# Patient Record
Sex: Male | Born: 1940
Health system: Southern US, Community
[De-identification: ages and names within clinical notes are randomized; demographics above are authoritative.]

---

## 2016-04-30 ENCOUNTER — Inpatient Hospital Stay (HOSPITAL_COMMUNITY): Payer: Non-veteran care

## 2016-04-30 ENCOUNTER — Inpatient Hospital Stay (HOSPITAL_COMMUNITY)
Admission: EM | Admit: 2016-04-30 | Discharge: 2016-05-10 | DRG: 870 | Disposition: A | Discharge status: Skilled Nursing Facility | Payer: Non-veteran care | Source: Other Acute Inpatient Hospital | Attending: Internal Medicine | Admitting: Internal Medicine

## 2016-04-30 DIAGNOSIS — A419 Sepsis, unspecified organism: Secondary | ICD-10-CM | POA: Diagnosis present

## 2016-04-30 DIAGNOSIS — E46 Unspecified protein-calorie malnutrition: Secondary | ICD-10-CM | POA: Diagnosis present

## 2016-04-30 DIAGNOSIS — J9601 Acute respiratory failure with hypoxia: Secondary | ICD-10-CM

## 2016-04-30 DIAGNOSIS — E876 Hypokalemia: Secondary | ICD-10-CM | POA: Diagnosis not present

## 2016-04-30 DIAGNOSIS — R0602 Shortness of breath: Secondary | ICD-10-CM

## 2016-04-30 DIAGNOSIS — E870 Hyperosmolality and hypernatremia: Secondary | ICD-10-CM | POA: Diagnosis present

## 2016-04-30 DIAGNOSIS — G9340 Encephalopathy, unspecified: Secondary | ICD-10-CM

## 2016-04-30 DIAGNOSIS — G9341 Metabolic encephalopathy: Secondary | ICD-10-CM | POA: Diagnosis present

## 2016-04-30 DIAGNOSIS — E872 Acidosis: Secondary | ICD-10-CM

## 2016-04-30 DIAGNOSIS — N179 Acute kidney failure, unspecified: Secondary | ICD-10-CM | POA: Diagnosis present

## 2016-04-30 DIAGNOSIS — R1310 Dysphagia, unspecified: Secondary | ICD-10-CM | POA: Diagnosis present

## 2016-04-30 DIAGNOSIS — E1140 Type 2 diabetes mellitus with diabetic neuropathy, unspecified: Secondary | ICD-10-CM | POA: Diagnosis present

## 2016-04-30 DIAGNOSIS — R0989 Other specified symptoms and signs involving the circulatory and respiratory systems: Secondary | ICD-10-CM

## 2016-04-30 DIAGNOSIS — Z6820 Body mass index (BMI) 20.0-20.9, adult: Secondary | ICD-10-CM | POA: Diagnosis not present

## 2016-04-30 DIAGNOSIS — R6520 Severe sepsis without septic shock: Secondary | ICD-10-CM | POA: Diagnosis present

## 2016-04-30 DIAGNOSIS — E1310 Other specified diabetes mellitus with ketoacidosis without coma: Secondary | ICD-10-CM | POA: Diagnosis present

## 2016-04-30 DIAGNOSIS — N170 Acute kidney failure with tubular necrosis: Secondary | ICD-10-CM | POA: Diagnosis present

## 2016-04-30 DIAGNOSIS — K219 Gastro-esophageal reflux disease without esophagitis: Secondary | ICD-10-CM | POA: Diagnosis present

## 2016-04-30 DIAGNOSIS — J150 Pneumonia due to Klebsiella pneumoniae: Secondary | ICD-10-CM

## 2016-04-30 DIAGNOSIS — R069 Unspecified abnormalities of breathing: Secondary | ICD-10-CM

## 2016-04-30 DIAGNOSIS — D6489 Other specified anemias: Secondary | ICD-10-CM | POA: Diagnosis present

## 2016-04-30 DIAGNOSIS — E875 Hyperkalemia: Secondary | ICD-10-CM | POA: Diagnosis present

## 2016-04-30 DIAGNOSIS — J189 Pneumonia, unspecified organism: Secondary | ICD-10-CM

## 2016-04-30 DIAGNOSIS — D696 Thrombocytopenia, unspecified: Secondary | ICD-10-CM | POA: Diagnosis present

## 2016-04-30 DIAGNOSIS — I248 Other forms of acute ischemic heart disease: Secondary | ICD-10-CM | POA: Diagnosis present

## 2016-04-30 DIAGNOSIS — J9600 Acute respiratory failure, unspecified whether with hypoxia or hypercapnia: Secondary | ICD-10-CM | POA: Diagnosis present

## 2016-04-30 DIAGNOSIS — J9690 Respiratory failure, unspecified, unspecified whether with hypoxia or hypercapnia: Secondary | ICD-10-CM

## 2016-04-30 DIAGNOSIS — I10 Essential (primary) hypertension: Secondary | ICD-10-CM | POA: Diagnosis present

## 2016-04-30 LAB — CBC WITH DIFFERENTIAL/PLATELET
BASOS PCT: 0 %
Basophils Absolute: 0 10*3/uL (ref 0.0–0.1)
EOS ABS: 0 10*3/uL (ref 0.0–0.7)
Eosinophils Relative: 0 %
HCT: 28.4 % — ABNORMAL LOW (ref 39.0–52.0)
HEMOGLOBIN: 9.6 g/dL — AB (ref 13.0–17.0)
LYMPHS ABS: 0.3 10*3/uL — AB (ref 0.7–4.0)
Lymphocytes Relative: 4 %
MCH: 29.5 pg (ref 26.0–34.0)
MCHC: 33.8 g/dL (ref 30.0–36.0)
MCV: 87.4 fL (ref 78.0–100.0)
MONO ABS: 0.2 10*3/uL (ref 0.1–1.0)
MONOS PCT: 3 %
NEUTROS PCT: 93 %
Neutro Abs: 5.7 10*3/uL (ref 1.7–7.7)
Platelets: 205 10*3/uL (ref 150–400)
RBC: 3.25 MIL/uL — ABNORMAL LOW (ref 4.22–5.81)
RDW: 13.8 % (ref 11.5–15.5)
WBC: 6.1 10*3/uL (ref 4.0–10.5)

## 2016-04-30 LAB — RENAL FUNCTION PANEL
Albumin: 2.2 g/dL — ABNORMAL LOW (ref 3.5–5.0)
Anion gap: 11 (ref 5–15)
BUN: 75 mg/dL — AB (ref 6–20)
CALCIUM: 7.2 mg/dL — AB (ref 8.9–10.3)
CO2: 22 mmol/L (ref 22–32)
CREATININE: 4.47 mg/dL — AB (ref 0.61–1.24)
Chloride: 109 mmol/L (ref 101–111)
GFR calc Af Amer: 14 mL/min — ABNORMAL LOW (ref >60)
GFR calc non Af Amer: 12 mL/min — ABNORMAL LOW (ref >60)
GLUCOSE: 116 mg/dL — AB (ref 65–99)
Phosphorus: 3.9 mg/dL (ref 2.5–4.6)
Potassium: 3.5 mmol/L (ref 3.5–5.1)
SODIUM: 142 mmol/L (ref 135–145)

## 2016-04-30 LAB — BASIC METABOLIC PANEL
ANION GAP: 15 (ref 5–15)
ANION GAP: 10 (ref 5–15)
BUN: 157 mg/dL — AB (ref 6–20)
BUN: 52 mg/dL — ABNORMAL HIGH (ref 6–20)
CALCIUM: 7.3 mg/dL — AB (ref 8.9–10.3)
CHLORIDE: 118 mmol/L — AB (ref 101–111)
CO2: 11 mmol/L — ABNORMAL LOW (ref 22–32)
CO2: 25 mmol/L (ref 22–32)
Calcium: 7.4 mg/dL — ABNORMAL LOW (ref 8.9–10.3)
Chloride: 104 mmol/L (ref 101–111)
Creatinine, Ser: 10.39 mg/dL — ABNORMAL HIGH (ref 0.61–1.24)
Creatinine, Ser: 3.32 mg/dL — ABNORMAL HIGH (ref 0.61–1.24)
GFR, EST AFRICAN AMERICAN: 5 mL/min — AB (ref >60)
GFR, EST AFRICAN AMERICAN: 20 mL/min — AB (ref >60)
GFR, EST NON AFRICAN AMERICAN: 4 mL/min — AB (ref >60)
GFR, EST NON AFRICAN AMERICAN: 17 mL/min — AB (ref >60)
Glucose, Bld: 292 mg/dL — ABNORMAL HIGH (ref 65–99)
Glucose, Bld: 210 mg/dL — ABNORMAL HIGH (ref 65–99)
POTASSIUM: 4.3 mmol/L (ref 3.5–5.1)
POTASSIUM: 3.7 mmol/L (ref 3.5–5.1)
SODIUM: 144 mmol/L (ref 135–145)
SODIUM: 139 mmol/L (ref 135–145)

## 2016-04-30 LAB — CBC
HEMATOCRIT: 29.7 % — AB (ref 39.0–52.0)
Hemoglobin: 9.9 g/dL — ABNORMAL LOW (ref 13.0–17.0)
MCH: 29.3 pg (ref 26.0–34.0)
MCHC: 33.3 g/dL (ref 30.0–36.0)
MCV: 87.9 fL (ref 78.0–100.0)
Platelets: 202 10*3/uL (ref 150–400)
RBC: 3.38 MIL/uL — ABNORMAL LOW (ref 4.22–5.81)
RDW: 13.7 % (ref 11.5–15.5)
WBC: 5.9 10*3/uL (ref 4.0–10.5)

## 2016-04-30 LAB — POCT I-STAT, CHEM 8
BUN: >140 mg/dL — ABNORMAL HIGH (ref 6–20)
CALCIUM ION: 1.11 mmol/L — AB (ref 1.15–1.40)
CHLORIDE: 115 mmol/L — AB (ref 101–111)
Creatinine, Ser: 10.9 mg/dL — ABNORMAL HIGH (ref 0.61–1.24)
GLUCOSE: 274 mg/dL — AB (ref 65–99)
HCT: 26 % — ABNORMAL LOW (ref 39.0–52.0)
Hemoglobin: 8.8 g/dL — ABNORMAL LOW (ref 13.0–17.0)
Potassium: 4.2 mmol/L (ref 3.5–5.1)
Sodium: 147 mmol/L — ABNORMAL HIGH (ref 135–145)
TCO2: 13 mmol/L (ref 0–100)

## 2016-04-30 LAB — URINALYSIS, ROUTINE W REFLEX MICROSCOPIC
GLUCOSE, UA: 100 mg/dL — AB
KETONES UR: NEGATIVE mg/dL
LEUKOCYTES UA: NEGATIVE
NITRITE: NEGATIVE
PH: 5 (ref 5.0–8.0)
Protein, ur: NEGATIVE mg/dL
Specific Gravity, Urine: 1.018 (ref 1.005–1.030)

## 2016-04-30 LAB — BLOOD GAS, ARTERIAL
ACID-BASE DEFICIT: 7.7 mmol/L — AB (ref 0.0–2.0)
Bicarbonate: 16.6 mmol/L — ABNORMAL LOW (ref 20.0–28.0)
Drawn by: 44137
FIO2: 1
LHR: 30 {breaths}/min
O2 SAT: 98.8 %
PEEP/CPAP: 5 cmH2O
PH ART: 7.393 (ref 7.350–7.450)
Patient temperature: 95.5
VT: 530 mL
pCO2 arterial: 27.2 mmHg — ABNORMAL LOW (ref 32.0–48.0)
pO2, Arterial: 166 mmHg — ABNORMAL HIGH (ref 83.0–108.0)

## 2016-04-30 LAB — POCT I-STAT 3, ART BLOOD GAS (G3+)
ACID-BASE DEFICIT: 18 mmol/L — AB (ref 0.0–2.0)
Bicarbonate: 8.8 mmol/L — ABNORMAL LOW (ref 20.0–28.0)
O2 SAT: 89 %
PH ART: 7.182 — AB (ref 7.350–7.450)
TCO2: 9 mmol/L (ref 0–100)
pCO2 arterial: 23.4 mmHg — ABNORMAL LOW (ref 32.0–48.0)
pO2, Arterial: 68 mmHg — ABNORMAL LOW (ref 83.0–108.0)

## 2016-04-30 LAB — GLUCOSE, CAPILLARY
GLUCOSE-CAPILLARY: 127 mg/dL — AB (ref 65–99)
GLUCOSE-CAPILLARY: 139 mg/dL — AB (ref 65–99)
GLUCOSE-CAPILLARY: 154 mg/dL — AB (ref 65–99)
GLUCOSE-CAPILLARY: 160 mg/dL — AB (ref 65–99)
GLUCOSE-CAPILLARY: 180 mg/dL — AB (ref 65–99)
GLUCOSE-CAPILLARY: 191 mg/dL — AB (ref 65–99)
GLUCOSE-CAPILLARY: 233 mg/dL — AB (ref 65–99)
GLUCOSE-CAPILLARY: 298 mg/dL — AB (ref 65–99)
GLUCOSE-CAPILLARY: 312 mg/dL — AB (ref 65–99)
GLUCOSE-CAPILLARY: 317 mg/dL — AB (ref 65–99)
GLUCOSE-CAPILLARY: 96 mg/dL (ref 65–99)
Glucose-Capillary: 273 mg/dL — ABNORMAL HIGH (ref 65–99)
Glucose-Capillary: 277 mg/dL — ABNORMAL HIGH (ref 65–99)
Glucose-Capillary: 260 mg/dL — ABNORMAL HIGH (ref 65–99)
Glucose-Capillary: 198 mg/dL — ABNORMAL HIGH (ref 65–99)
Glucose-Capillary: 200 mg/dL — ABNORMAL HIGH (ref 65–99)
Glucose-Capillary: 124 mg/dL — ABNORMAL HIGH (ref 65–99)
Glucose-Capillary: 102 mg/dL — ABNORMAL HIGH (ref 65–99)

## 2016-04-30 LAB — POCT ACTIVATED CLOTTING TIME
ACTIVATED CLOTTING TIME: 125 s
ACTIVATED CLOTTING TIME: 125 s
Activated Clotting Time: 125 seconds
Activated Clotting Time: 158 seconds
Activated Clotting Time: 191 seconds
Activated Clotting Time: 213 seconds
Activated Clotting Time: 219 seconds
Activated Clotting Time: 241 seconds
Activated Clotting Time: 246 seconds

## 2016-04-30 LAB — URIC ACID: Uric Acid, Serum: 11.3 mg/dL — ABNORMAL HIGH (ref 4.4–7.6)

## 2016-04-30 LAB — TROPONIN I
TROPONIN I: 0.04 ng/mL — AB (ref <0.03)
TROPONIN I: 0.04 ng/mL — AB (ref <0.03)
TROPONIN I: 0.4 ng/mL — AB (ref <0.03)
Troponin I: 0.12 ng/mL (ref <0.03)
Troponin I: 0.41 ng/mL (ref <0.03)

## 2016-04-30 LAB — IRON AND TIBC
Iron: 91 ug/dL (ref 45–182)
Saturation Ratios: 51 % — ABNORMAL HIGH (ref 17.9–39.5)
TIBC: 179 ug/dL — ABNORMAL LOW (ref 250–450)
UIBC: 88 ug/dL

## 2016-04-30 LAB — COMPREHENSIVE METABOLIC PANEL
ALBUMIN: 2.2 g/dL — AB (ref 3.5–5.0)
ALT: 12 U/L — ABNORMAL LOW (ref 17–63)
ANION GAP: 14 (ref 5–15)
AST: 21 U/L (ref 15–41)
Alkaline Phosphatase: 43 U/L (ref 38–126)
BUN: 160 mg/dL — ABNORMAL HIGH (ref 6–20)
CO2: 13 mmol/L — AB (ref 22–32)
Calcium: 7.5 mg/dL — ABNORMAL LOW (ref 8.9–10.3)
Chloride: 117 mmol/L — ABNORMAL HIGH (ref 101–111)
Creatinine, Ser: 10.11 mg/dL — ABNORMAL HIGH (ref 0.61–1.24)
GFR calc Af Amer: 5 mL/min — ABNORMAL LOW (ref >60)
GFR calc non Af Amer: 4 mL/min — ABNORMAL LOW (ref >60)
GLUCOSE: 283 mg/dL — AB (ref 65–99)
POTASSIUM: 4 mmol/L (ref 3.5–5.1)
SODIUM: 144 mmol/L (ref 135–145)
Total Bilirubin: 0.7 mg/dL (ref 0.3–1.2)
Total Protein: 4 g/dL — ABNORMAL LOW (ref 6.5–8.1)

## 2016-04-30 LAB — LACTATE DEHYDROGENASE: LDH: 107 U/L (ref 98–192)

## 2016-04-30 LAB — URINE MICROSCOPIC-ADD ON: WBC UA: NONE SEEN WBC/hpf (ref 0–5)

## 2016-04-30 LAB — PHOSPHORUS: Phosphorus: 7.3 mg/dL — ABNORMAL HIGH (ref 2.5–4.6)

## 2016-04-30 LAB — STREP PNEUMONIAE URINARY ANTIGEN: Strep Pneumo Urinary Antigen: NEGATIVE

## 2016-04-30 LAB — CREATININE, URINE, RANDOM: CREATININE, URINE: 70.7 mg/dL

## 2016-04-30 LAB — CK: CK TOTAL: 199 U/L (ref 49–397)

## 2016-04-30 LAB — SODIUM, URINE, RANDOM: Sodium, Ur: 73 mmol/L

## 2016-04-30 LAB — MRSA PCR SCREENING: MRSA by PCR: NEGATIVE

## 2016-04-30 LAB — BRAIN NATRIURETIC PEPTIDE: B NATRIURETIC PEPTIDE 5: 114.7 pg/mL — AB (ref 0.0–100.0)

## 2016-04-30 LAB — LACTIC ACID, PLASMA
LACTIC ACID, VENOUS: 3.5 mmol/L — AB (ref 0.5–1.9)
Lactic Acid, Venous: 3 mmol/L (ref 0.5–1.9)

## 2016-04-30 LAB — PROCALCITONIN: PROCALCITONIN: 28.75 ng/mL

## 2016-04-30 MED ORDER — PRISMASOL BGK 0/2.5 32-2.5 MEQ/L IV SOLN
INTRAVENOUS | Status: DC
  Administered 2016-04-30: 08:00:00 via INTRAVENOUS_CENTRAL
  Filled 2016-04-30 (×9): qty 5000

## 2016-04-30 MED ORDER — MIDAZOLAM HCL 2 MG/2ML IJ SOLN
1.0000 mg | INTRAMUSCULAR | Status: DC | PRN
  Administered 2016-05-01 – 2016-05-03 (×6): 1 mg via INTRAVENOUS
  Filled 2016-04-30 (×6): qty 2

## 2016-04-30 MED ORDER — VANCOMYCIN HCL 500 MG IV SOLR
500.0000 mg | INTRAVENOUS | Status: DC
  Administered 2016-05-01: 500 mg via INTRAVENOUS
  Filled 2016-04-30: qty 500

## 2016-04-30 MED ORDER — SODIUM CHLORIDE 0.9 % IV SOLN: INTRAVENOUS | Status: DC

## 2016-04-30 MED ORDER — SODIUM CHLORIDE 0.9 % IV BOLUS (SEPSIS)
500.0000 mL | Freq: Once | INTRAVENOUS | Status: AC
  Administered 2016-04-30: 500 mL via INTRAVENOUS

## 2016-04-30 MED ORDER — PIPERACILLIN-TAZOBACTAM 3.375 G IVPB 30 MIN
3.3750 g | Freq: Four times a day (QID) | INTRAVENOUS | Status: DC
  Administered 2016-04-30 – 2016-05-02 (×9): 3.375 g via INTRAVENOUS
  Filled 2016-04-30 (×12): qty 50

## 2016-04-30 MED ORDER — DEXTROSE-NACL 5-0.45 % IV SOLN
INTRAVENOUS | Status: DC
  Administered 2016-04-30 – 2016-05-01 (×2): via INTRAVENOUS

## 2016-04-30 MED ORDER — PRISMASOL BGK 0/2.5 32-2.5 MEQ/L IV SOLN
INTRAVENOUS | Status: DC
  Administered 2016-04-30: 07:00:00 via INTRAVENOUS_CENTRAL
  Filled 2016-04-30 (×3): qty 5000

## 2016-04-30 MED ORDER — SODIUM CHLORIDE 0.9 % IV SOLN
Freq: Once | INTRAVENOUS | Status: AC
  Administered 2016-04-30: 15:00:00 via INTRAVENOUS

## 2016-04-30 MED ORDER — PRISMASOL BGK 4/2.5 32-4-2.5 MEQ/L IV SOLN
INTRAVENOUS | Status: DC
  Administered 2016-04-30 – 2016-05-04 (×35): via INTRAVENOUS_CENTRAL
  Filled 2016-04-30 (×45): qty 5000

## 2016-04-30 MED ORDER — HEPARIN SODIUM (PORCINE) 1000 UNIT/ML DIALYSIS
1000.0000 [IU] | INTRAMUSCULAR | Status: DC | PRN
  Filled 2016-04-30: qty 3

## 2016-04-30 MED ORDER — MIDAZOLAM HCL 2 MG/2ML IJ SOLN
INTRAMUSCULAR | Status: AC
  Administered 2016-04-30: 08:00:00
  Filled 2016-04-30: qty 4

## 2016-04-30 MED ORDER — SODIUM CHLORIDE 0.9 % IJ SOLN
250.0000 [IU]/h | INTRAMUSCULAR | Status: DC
  Administered 2016-04-30: 1200 [IU]/h via INTRAVENOUS_CENTRAL
  Administered 2016-04-30: 500 [IU]/h via INTRAVENOUS_CENTRAL
  Administered 2016-04-30: 1500 [IU]/h via INTRAVENOUS_CENTRAL
  Filled 2016-04-30 (×3): qty 2

## 2016-04-30 MED ORDER — SODIUM CHLORIDE 0.9 % IV SOLN
INTRAVENOUS | Status: DC
  Administered 2016-04-30: 2.4 [IU]/h via INTRAVENOUS
  Filled 2016-04-30: qty 2.5

## 2016-04-30 MED ORDER — PRISMASOL BGK 4/2.5 32-4-2.5 MEQ/L IV SOLN
INTRAVENOUS | Status: DC
  Administered 2016-04-30 – 2016-05-04 (×12): via INTRAVENOUS_CENTRAL
  Filled 2016-04-30 (×16): qty 5000

## 2016-04-30 MED ORDER — SODIUM BICARBONATE 8.4 % IV SOLN
INTRAVENOUS | Status: DC
  Administered 2016-04-30 (×3): via INTRAVENOUS
  Filled 2016-04-30 (×6): qty 150

## 2016-04-30 MED ORDER — SODIUM BICARBONATE 8.4 % IV SOLN
INTRAVENOUS | Status: AC
  Filled 2016-04-30: qty 100

## 2016-04-30 MED ORDER — PRISMASOL BGK 0/2.5 32-2.5 MEQ/L IV SOLN
INTRAVENOUS | Status: DC
  Administered 2016-04-30: 08:00:00 via INTRAVENOUS_CENTRAL
  Filled 2016-04-30 (×3): qty 5000

## 2016-04-30 MED ORDER — DEXTROSE 5 % IV SOLN
500.0000 mg | Freq: Every day | INTRAVENOUS | Status: DC
  Administered 2016-04-30 – 2016-05-02 (×3): 500 mg via INTRAVENOUS
  Filled 2016-04-30 (×3): qty 500

## 2016-04-30 MED ORDER — SODIUM CHLORIDE 0.9 % IV SOLN
250.0000 mL | INTRAVENOUS | Status: DC | PRN
  Administered 2016-04-30 – 2016-05-04 (×4): 250 mL via INTRAVENOUS

## 2016-04-30 MED ORDER — FAMOTIDINE IN NACL 20-0.9 MG/50ML-% IV SOLN
20.0000 mg | Freq: Two times a day (BID) | INTRAVENOUS | Status: DC
  Administered 2016-04-30 – 2016-05-01 (×2): 20 mg via INTRAVENOUS
  Filled 2016-04-30 (×3): qty 50

## 2016-04-30 MED ORDER — PRISMASOL BGK 4/2.5 32-4-2.5 MEQ/L IV SOLN
INTRAVENOUS | Status: DC
  Administered 2016-04-30 – 2016-05-04 (×14): via INTRAVENOUS_CENTRAL
  Filled 2016-04-30 (×18): qty 5000

## 2016-04-30 MED ORDER — DEXTROSE 5 % IV SOLN
2.0000 ug/min | INTRAVENOUS | Status: DC
  Administered 2016-04-30: 15 ug/min via INTRAVENOUS
  Administered 2016-04-30 (×2): 10 ug/min via INTRAVENOUS
  Administered 2016-05-01: 15 ug/min via INTRAVENOUS
  Filled 2016-04-30 (×3): qty 4

## 2016-04-30 MED ORDER — HEPARIN SODIUM (PORCINE) 5000 UNIT/ML IJ SOLN
5000.0000 [IU] | Freq: Three times a day (TID) | INTRAMUSCULAR | Status: DC
Start: 2016-04-30 — End: 2016-05-04
  Administered 2016-04-30 – 2016-05-04 (×11): 5000 [IU] via SUBCUTANEOUS
  Filled 2016-04-30 (×13): qty 1

## 2016-04-30 MED ORDER — NOREPINEPHRINE BITARTRATE 1 MG/ML IV SOLN
0.0000 ug/min | INTRAVENOUS | Status: DC
  Filled 2016-04-30: qty 4

## 2016-04-30 MED ORDER — INSULIN GLARGINE 100 UNIT/ML ~~LOC~~ SOLN
25.0000 [IU] | SUBCUTANEOUS | Status: DC
  Administered 2016-04-30 – 2016-05-01 (×2): 25 [IU] via SUBCUTANEOUS
  Filled 2016-04-30 (×3): qty 0.25

## 2016-04-30 MED ORDER — SODIUM CHLORIDE 0.9 % IV SOLN: INTRAVENOUS | Status: AC

## 2016-04-30 MED ORDER — ORAL CARE MOUTH RINSE
15.0000 mL | Freq: Four times a day (QID) | OROMUCOSAL | Status: DC
  Administered 2016-05-01 – 2016-05-06 (×24): 15 mL via OROMUCOSAL

## 2016-04-30 MED ORDER — FENTANYL CITRATE (PF) 100 MCG/2ML IJ SOLN
INTRAMUSCULAR | Status: AC
  Filled 2016-04-30: qty 4

## 2016-04-30 MED ORDER — HEPARIN BOLUS VIA INFUSION (CRRT)
1000.0000 [IU] | INTRAVENOUS | Status: DC | PRN
  Administered 2016-04-30: 1000 [IU] via INTRAVENOUS_CENTRAL
  Filled 2016-04-30: qty 1000

## 2016-04-30 MED ORDER — ALBUTEROL SULFATE (2.5 MG/3ML) 0.083% IN NEBU: 2.5000 mg | INHALATION_SOLUTION | RESPIRATORY_TRACT | Status: DC | PRN

## 2016-04-30 MED ORDER — INSULIN ASPART 100 UNIT/ML ~~LOC~~ SOLN
1.0000 [IU] | SUBCUTANEOUS | Status: DC
  Administered 2016-05-01 – 2016-05-02 (×7): 1 [IU] via SUBCUTANEOUS

## 2016-04-30 MED ORDER — FENTANYL BOLUS VIA INFUSION
25.0000 ug | INTRAVENOUS | Status: DC | PRN
  Administered 2016-05-02: 50 ug via INTRAVENOUS
  Administered 2016-05-02: 25 ug via INTRAVENOUS
  Administered 2016-05-04: 50 ug via INTRAVENOUS
  Administered 2016-05-05: 25 ug via INTRAVENOUS
  Filled 2016-04-30: qty 25

## 2016-04-30 MED ORDER — DEXTROSE 10 % IV SOLN
INTRAVENOUS | Status: DC | PRN
  Administered 2016-05-01: 17:00:00 via INTRAVENOUS

## 2016-04-30 MED ORDER — SODIUM CHLORIDE 0.9 % IV SOLN
INTRAVENOUS | Status: DC
  Administered 2016-04-30: 11:00:00 via INTRAVENOUS

## 2016-04-30 MED ORDER — CHLORHEXIDINE GLUCONATE 0.12 % MT SOLN
15.0000 mL | Freq: Two times a day (BID) | OROMUCOSAL | Status: DC
  Administered 2016-04-30 – 2016-05-06 (×12): 15 mL via OROMUCOSAL
  Filled 2016-04-30 (×3): qty 15

## 2016-04-30 MED ORDER — FENTANYL CITRATE (PF) 100 MCG/2ML IJ SOLN: 50.0000 ug | Freq: Once | INTRAMUSCULAR | Status: AC

## 2016-04-30 MED ORDER — ONDANSETRON HCL 4 MG/2ML IJ SOLN
4.0000 mg | Freq: Four times a day (QID) | INTRAMUSCULAR | Status: DC | PRN
Start: 2016-04-30 — End: 2016-05-10

## 2016-04-30 MED ORDER — SODIUM CHLORIDE 0.9 % FOR CRRT
INTRAVENOUS_CENTRAL | Status: DC | PRN
  Administered 2016-05-03: 11:00:00 via INTRAVENOUS_CENTRAL
  Filled 2016-04-30 (×2): qty 1000

## 2016-04-30 MED ORDER — SODIUM CHLORIDE 0.9 % IV SOLN
25.0000 ug/h | INTRAVENOUS | Status: DC
  Administered 2016-04-30: 50 ug/h via INTRAVENOUS
  Administered 2016-05-02: 250 ug/h via INTRAVENOUS
  Administered 2016-05-02: 100 ug/h via INTRAVENOUS
  Administered 2016-05-03: 400 ug/h via INTRAVENOUS
  Administered 2016-05-04: 200 ug/h via INTRAVENOUS
  Administered 2016-05-04: 300 ug/h via INTRAVENOUS
  Administered 2016-05-04: 200 ug/h via INTRAVENOUS
  Administered 2016-05-05: 100 ug/h via INTRAVENOUS
  Filled 2016-04-30 (×11): qty 50

## 2016-04-30 MED ORDER — ACETAMINOPHEN 325 MG PO TABS: 650.0000 mg | ORAL_TABLET | ORAL | Status: DC | PRN

## 2016-04-30 MED ORDER — HEPARIN SODIUM (PORCINE) 5000 UNIT/ML IJ SOLN: 5000.0000 [IU] | Freq: Three times a day (TID) | INTRAMUSCULAR | Status: DC

## 2016-04-30 MED ORDER — INSULIN ASPART 100 UNIT/ML ~~LOC~~ SOLN
0.0000 [IU] | SUBCUTANEOUS | Status: DC
  Administered 2016-04-30: 11 [IU] via SUBCUTANEOUS

## 2016-04-30 NOTE — Procedures (Signed)
Intubation Procedure Note Ross CanterburyDonnie Daniel [id number] [birth year]  Procedure: Intubation Indications: Respiratory insufficiency  Procedure Details Consent: Unable to obtain consent because of emergent medical necessity. Time Out: Verified patient identification, verified procedure, site/side was marked, verified correct patient position, special equipment/implants available, medications/allergies/relevent history reviewed, required imaging and test results available.  Performed  MAC and 3 Medications:  Fentanyl 100 mcg Etomidate 20 mg Versed 2mg  NMB rocuronium 50 mg   Evaluation Hemodynamic Status: Transient hypotension treated with pressors; O2 sats: stable throughout Patient's Current Condition: stable Complications: No apparent complications Patient did tolerate procedure well. Chest X-ray ordered to verify placement.  CXR: pending.   Brett CanalesSteve Minor ACNP [facility] Pager [phone] till 3 pm If no answer page [phone] 04/30/2016, 8:26 AM   Alyson ReedyWesam G. Judye Lorino, M.D. [facility]. Pager: [phone]. After hours pager: [phone].

## 2016-04-30 NOTE — Progress Notes (Signed)
Called bedside with patient in respiratory failure.  AMS and clearly becoming more fatigued.  ABG revealed severe metabolic acidosis.  On exam patient arouses but not enough to protect airway.  Labs reviewed, Na is down to 144 and K down to 4 but last bicarb is 13.  Patient needs to be intubated.  Will push 2 amps of bicarb prior to sedating and paralyzing given profound metabolic acidosis.  After intubation will get a CXR and ABG to confirm then start CRRT.  Will also start levophed prior to intubation as I anticipate patient will drop his pressor with sedation.  Will continue to follow.  Discussed with bedside RN and PCCM-NP.  The patient is critically ill with multiple organ systems failure and requires high complexity decision making for assessment and support, frequent evaluation and titration of therapies, application of advanced monitoring technologies and extensive interpretation of multiple databases.   Critical Care Time devoted to patient care services described in this note is  45  Minutes. This time reflects time of care of this signee Dr Koren BoundWesam Yacoub. This critical care time does not reflect procedure time, or teaching time or supervisory time of PA/NP/Med student/Med Resident etc but could involve care discussion time.  Alyson ReedyWesam G. Yacoub, M.D. [facility]. Pager: [phone]. After hours pager: [phone].

## 2016-04-30 NOTE — Progress Notes (Signed)
eLink Physician-Brief Progress Note Patient Name: Ross CanterburyDonnie Wolken DOB: June 02, 1941 MRN: [id number]   Date of Service  04/30/2016  HPI/Events of Note  Multiple issues: 1. DKA with blood glucose now = 154 and 2. CVP = 2.   eICU Interventions  Will order: 1. BMP now to be sure AG has remained closed.  2. 0.9 NaCl 500 mL IV now.      Intervention Category Intermediate Interventions: Hyperglycemia - evaluation and treatment  Tammee Thielke Dennard Nipugene 04/30/2016, 9:14 PM

## 2016-04-30 NOTE — Progress Notes (Signed)
eLink Physician-Brief Progress Note Patient Name: Ross CanterburyDonnie Daniel DOB: [birth year] MRN: [id number]   Date of Service  04/30/2016  HPI/Events of Note  Anion Gap = 10 and HCO3- = 25. Blood glucose = 180.   eICU Interventions  Will order: 1. D/C NaHCO3 IV infusion.  2. Transition to Lantus/Q 4 hour Novolog SSI.     Intervention Category Intermediate Interventions: Hyperglycemia - evaluation and treatment  Sommer,Steven Dennard Nipugene 04/30/2016, 10:34 PM

## 2016-04-30 NOTE — Procedures (Signed)
Hemodialysis Catheter Insertion Procedure Note Janece CanterburyDonnie Charon [id number] Jan 03, 1941  Procedure: Insertion of Hemodialysis Catheter Indications: CVVH  Procedure Details Consent: Unable to obtain consent because of emergent medical necessity. Time Out: Verified patient identification, verified procedure, site/side was marked, verified correct patient position, special equipment/implants available, medications/allergies/relevent history reviewed, required imaging and test results available.  Performed  Maximum sterile technique was used including antiseptics, cap, gloves, gown, hand hygiene, mask and sheet. Skin prep: Chlorhexidine; local anesthetic administered A antimicrobial bonded/coated triple lumen catheter was placed in the left internal jugular vein using the Seldinger technique.  Evaluation Blood flow good Complications: No apparent complications Patient did tolerate procedure well. Chest X-ray ordered to verify placement.  CXR: pending.  Procedure performed under direct ultrasound guidance for real time vessel cannulation.      Rutherford Guysahul Gabrille Kilbride, GeorgiaPA - C [place] [facility] Pgr: [phone]  or [phone] 04/30/2016, 4:56 AM

## 2016-04-30 NOTE — Procedures (Signed)
Critical ABG results hand delivered to Dr. Isaiah SergeMannam.

## 2016-04-30 NOTE — Progress Notes (Signed)
eLink Physician-Brief Progress Note Patient Name: Ross CanterburyDonnie Pineau DOB: [birth year] MRN: [id number]   Date of Service  04/30/2016  HPI/Events of Note  Notified of need for stress ulcer prophylaxis. Intubated and ventilated.   eICU Interventions  Will order: 1. Pepcid 20 mg IV now and BID.     Intervention Category Intermediate Interventions: Best-practice therapies (e.g. DVT, beta blocker, etc.)  Kristyanna Barcelo Eugene 04/30/2016, 3:56 PM

## 2016-04-30 NOTE — Consult Note (Signed)
Reason for Consult:AKI, huyperkalemia, acidemia Referring Physician: Dr. Coralyn HellingSimonds  Ross Daniel is an 75 y.o. male.  HPI: [id number]   [facility] after being found down , confused, Cr 14.3/BUN 190, WBC 28.5, and hypotension.  Has pneu on CXR.  He cannot give reliable hx.  Says DM for 8 yr but denies HTN, FH of renal dz, no hx stones, UTIs, no rash or pain.  OX1.  Has Lisinopril 20mg , Metformen 1mg  bid, Meloxicam, gabapentin, glipizide, sidenifil in med bag. Bicarb of 3 an K of 7.8 there.  Now is 4.8 via I Stat. Knows on Ibuprofen also.\  Review of systems not obtained due to patient factors.  No past medical history on file.  No past surgical history on file.  No family history on file.  Social History:  has no tobacco, alcohol, and drug history on file.  Allergies: No Known Allergies  Medications:  I have reviewed the patient's current medications. Prior to Admission:  No prescriptions prior to admission.    Results for orders placed or performed during the hospital encounter of 04/30/16 (from the past 48 hour(s))  Glucose, capillary     Status: Abnormal   Collection Time: 04/30/16  3:02 AM  Result Value Ref Range   Glucose-Capillary 317 (H) 65 - 99 mg/dL  Lactic acid, plasma     Status: Abnormal   Collection Time: 04/30/16  4:32 AM  Result Value Ref Range   Lactic Acid, Venous 3.5 (HH) 0.5 - 1.9 mmol/L    Comment: CRITICAL RESULT CALLED TO, READ BACK BY AND VERIFIED WITH: J.DUNLAP,RN 0543 04/30/16 M.CAMPBELL   I-STAT 3, arterial blood gas (G3+)     Status: Abnormal   Collection Time: 04/30/16  5:24 AM  Result Value Ref Range   pH, Arterial 7.182 (LL) 7.350 - 7.450   pCO2 arterial 23.4 (L) 32.0 - 48.0 mmHg   pO2, Arterial 68.0 (L) 83.0 - 108.0 mmHg   Bicarbonate 8.8 (L) 20.0 - 28.0 mmol/L   TCO2 9 0 - 100 mmol/L   O2 Saturation 89.0 %   Acid-base deficit 18.0 (H) 0.0 - 2.0 mmol/L   Patient temperature HIDE    Collection site RADIAL, ALLEN'S TEST  ACCEPTABLE    Drawn by RT    Sample type ARTERIAL    Comment NOTIFIED PHYSICIAN     Dg Chest Port 1 View  Result Date: 04/30/2016 CLINICAL DATA:  Hemodialysis catheter placement.  Initial encounter. EXAM: PORTABLE CHEST 1 VIEW COMPARISON:  None. FINDINGS: The patient's hemodialysis catheter noted ending overlying the mid SVC. Bibasilar airspace opacities, left greater than right, likely reflect multifocal pneumonia. Asymmetric pulmonary edema might have a similar appearance. No pleural effusion or pneumothorax is seen. The cardiomediastinal silhouette is normal in size. No acute osseous abnormalities are identified. IMPRESSION: 1. Hemodialysis catheter noted ending overlying the mid SVC. 2. Bibasilar airspace opacities, left greater than right, likely reflect multifocal pneumonia. Asymmetric pulmonary edema might have a similar appearance. Electronically Signed   By: Roanna RaiderJeffery  Chang M.D.   On: 04/30/2016 05:05    ROS Blood pressure (!) 114/58, pulse (!) 101, temperature (!) 94.4 F (34.7 C), temperature source Oral, resp. rate 14, height 5\' 7"  (1.702 m), weight 61.3 kg (135 lb 2.3 oz), SpO2 93 %. Physical Exam Physical Examination: General appearance - confused, unkempt Mental status - OX1 Eyes - pupils equal and reactive, extraocular eye movements intact, funduscopic exam normal, discs flat and sharp Mouth - few teeth, poor hygiene Neck -  adenopathy noted PCL Lymphatics - posterior cervical nodes Chest - scattered rales and rhonchi Heart - normal rate, regular rhythm, normal S1, S2, no murmurs, rubs, clicks or gallops Abdomen - pos bs, liver down 4 cm.  soft Neurological - HOH, sym facies, moves all extrem. Ox1 Extremities - peripheral pulses normal, no pedal edema, no clubbing or cyanosis, no pedal edema noted Skin - normal coloration and turgor, no rashes, no suspicious skin lesions noted  Assessment/Plan: 1 AKI cannot r/o CKD, Multiple toxins, Lisinopril, Metformen, NSAIDs, in setting  of low bps.  PMH not clear, can call primary in am.  Vol ok to low,  Will use CRRT to lower K, solute,  2 DM 3 Hypertension: not clear but on Lisinopril 4. Anemia not an issue 5. 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 Toxic not clearly drug 7 Pneu on AB, sepsis tx 8 Sepsis P CRRt, U/S, UA, reeval chem, get Hx, AB, ivf.  Ross Daniel L 04/30/2016, 5:58 AM

## 2016-04-30 NOTE — Progress Notes (Signed)
Pharmacy Antibiotic Note  Janece CanterburyDonnie Retz is a 75 y.o. male admitted on 04/30/2016 with ARF/PNA/sepsis .  Pharmacy has been consulted for Vancomycin and Zosyn dosing.  Vancomycin 1250 mg IV and Zosyn 3.375 g IV given in ED at ~0030.  CVVH to start shortly.  Plan: Vancomycin 500 mg IV q24h  Zosyn 3.375 g IV q6h  Height: 5\' 7"  (170.2 cm) Weight: 135 lb 2.3 oz (61.3 kg) IBW/kg (Calculated) : 66.1  Temp (24hrs), Avg:94.4 F (34.7 C), Min:94.4 F (34.7 C), Max:94.4 F (34.7 C)  [facility]  WBC  28.6 Hgb  12.9   Hct  39.7 Plt  392  SCr  14.3  No results for input(s): WBC, CREATININE, LATICACIDVEN, VANCOTROUGH, VANCOPEAK, VANCORANDOM, GENTTROUGH, GENTPEAK, GENTRANDOM, TOBRATROUGH, TOBRAPEAK, TOBRARND, AMIKACINPEAK, AMIKACINTROU, AMIKACIN in the last 168 hours.  CrCl cannot be calculated (No order found.).    No Known Allergies   Eddie Candlebbott, Alanta Scobey Vernon 04/30/2016 5:35 AM

## 2016-04-30 NOTE — H&P (Addendum)
PULMONARY / CRITICAL CARE MEDICINE   Name: Ross Daniel MRN: [id number] DOB: 04-25-1941    ADMISSION DATE:  04/30/2016 CONSULTATION DATE:  04/30/16  REFERRING MD:  [facility]  CHIEF COMPLAINT:  Pneumonia, sepsis, AKI  HISTORY OF PRESENT ILLNESS:   [name] is a 75 year old with possible history of diabetes, hypertension. He was found unresponsive in his underwear outside his house by neightbours. He was brought to [facility] ED where he was found to have pneumonia, sepsis with severe anion Metabolic acidosis, lactic acidosis, AKI (labs below). He has been transferred to [facility] ED for further evaluation.   PAST MEDICAL HISTORY :  He  has no past medical history on file.  PAST SURGICAL HISTORY: He  has no past surgical history on file.  No Known Allergies  No current facility-administered medications on file prior to encounter.    No current outpatient prescriptions on file prior to encounter.    FAMILY HISTORY:  His has no family status information on file.    SOCIAL HISTORY: He    REVIEW OF SYSTEMS:   Unable to obtain as pt is unresponsive.  SUBJECTIVE:    VITAL SIGNS: BP (!) 94/49 (BP Location: Right Arm)   Pulse (!) 105   Temp (!) 94.4 F (34.7 C) (Oral)   Resp (!) 22   Ht 5\' 7"  (1.702 m)   Wt 135 lb 2.3 oz (61.3 kg)   SpO2 97%   BMI 21.17 kg/m   HEMODYNAMICS:    VENTILATOR SETTINGS:    INTAKE / OUTPUT: No intake/output data recorded.  PHYSICAL EXAMINATION: General:  Awake, confused. Neuro:  Moves all four extremities, no focal deficits HEENT:  PERRL EOMI Cardiovascular:  Tachycardia, No MRG Lungs:  Clear, No wheeze, crackles Abdomen:  Soft, + BS Musculoskeletal:  Normal tone and bulk Skin:  Intact  LABS:  BMET No results for input(s): NA, K, CL, CO2, BUN, CREATININE, GLUCOSE in the last 168 hours.  Electrolytes No results for input(s): CALCIUM, MG, PHOS in the last 168 hours.  CBC No results for input(s): WBC, HGB, HCT, PLT in the last  168 hours.  Coag's No results for input(s): APTT, INR in the last 168 hours.  Sepsis Markers No results for input(s): LATICACIDVEN, PROCALCITON, O2SATVEN in the last 168 hours.  ABG No results for input(s): PHART, PCO2ART, PO2ART in the last 168 hours.  Liver Enzymes No results for input(s): AST, ALT, ALKPHOS, BILITOT, ALBUMIN in the last 168 hours.  Cardiac Enzymes No results for input(s): TROPONINI, PROBNP in the last 168 hours.  Glucose  Recent Labs Lab 04/30/16 0302  GLUCAP 317*    Imaging No results found.  STUDIES:  Labs studies from [facility] Ph 7.046/13/94/94% Bicarb 3 Anion gap 31 Lactic acid 4.1 WBC 28 BUN/Cr 190/13.4 K 7.8 Tylenol, alcohol, salicylate- Neg, UDS- Neg  CT head 04/29/16- No acute findings CT chest abdomen pelvis 04/29/16-Airspace consolidation in the left lower lobe consistent with acute pneumonia. Similar findings lesser degree on the right. Severe bilateral pan-lobar emphysema. No acute findings in the abdomen, pelvis.  CULTURES: Bcx X 2 9/6>  Ucx 9/6 > Sputum Cx 9/6 >  ANTIBIOTICS: Vanco 9/6> Zosyn 9/6> Azithro 9/6>  SIGNIFICANT EVENTS: 9/6- Admit with sepsis, DKA, AKI  LINES/TUBES:  DISCUSSION: 75 year old with diabetes, hypertension admitted with severe sepsis, multilobar pneumonia, possible aspiration in the setting of acute kidney injury, lactic acidosis, hyperkalemia.  ASSESSMENT / PLAN:  PULMONARY A: Pneumonia, aspiration P:   Continue supplemental O2 CXR, repeat ABG. Abx as  above  CARDIOVASCULAR A:  Sepsis, hypotension Got total 4 lt NS so far. P:  Continue fluid resuscitation Bolus 1 more Lt and then 125 cc/hr Follow LA, troponin Echo in AM  RENAL A:   AKI Hyperkalemia P:   Given insulin, dextrose, calcium gluc, bicarb Follow BMP, recheck EKG HD cath in anticipation of dialysis. Bicarb drip Nephrology already called.  GASTROINTESTINAL A:   Stable P:   Keep NPO  HEMATOLOGIC A:    Leukocytosis, sepsis P:  Monitor  INFECTIOUS A:   Multilobar pneumonia P:   Continue vanco, zosyn Add azithro Check urine pneumococcal, legionella Follow Pct and cultures.  ENDOCRINE A:   DM DKA P:   Start insulin drip DKA protocol  NEUROLOGIC A:   Altered Encephalopathy likely from sepsis, metabolic derangements P:    FAMILY  - Updates: No family at bedside. - Inter-disciplinary family meet or Palliative Care meeting due by:  9/6.  Critical care time- 45 mins   Chilton GreathousePraveen Shaunika Italiano MD [place] [facility] Pager [phone] If no answer or after 3pm call: [phone] 04/30/2016, 4:41 AM

## 2016-05-01 ENCOUNTER — Other Ambulatory Visit (HOSPITAL_COMMUNITY): Payer: Self-pay

## 2016-05-01 ENCOUNTER — Inpatient Hospital Stay (HOSPITAL_COMMUNITY): Payer: Non-veteran care

## 2016-05-01 DIAGNOSIS — R0989 Other specified symptoms and signs involving the circulatory and respiratory systems: Secondary | ICD-10-CM

## 2016-05-01 DIAGNOSIS — R069 Unspecified abnormalities of breathing: Secondary | ICD-10-CM

## 2016-05-01 DIAGNOSIS — J989 Respiratory disorder, unspecified: Secondary | ICD-10-CM

## 2016-05-01 LAB — GLUCOSE, CAPILLARY
GLUCOSE-CAPILLARY: 148 mg/dL — AB (ref 65–99)
GLUCOSE-CAPILLARY: 90 mg/dL (ref 65–99)
GLUCOSE-CAPILLARY: 113 mg/dL — AB (ref 65–99)
GLUCOSE-CAPILLARY: 132 mg/dL — AB (ref 65–99)
GLUCOSE-CAPILLARY: 119 mg/dL — AB (ref 65–99)
GLUCOSE-CAPILLARY: 130 mg/dL — AB (ref 65–99)
Glucose-Capillary: 118 mg/dL — ABNORMAL HIGH (ref 65–99)
Glucose-Capillary: 129 mg/dL — ABNORMAL HIGH (ref 65–99)
Glucose-Capillary: 130 mg/dL — ABNORMAL HIGH (ref 65–99)

## 2016-05-01 LAB — BASIC METABOLIC PANEL
ANION GAP: 9 (ref 5–15)
BUN: 37 mg/dL — ABNORMAL HIGH (ref 6–20)
CALCIUM: 7.4 mg/dL — AB (ref 8.9–10.3)
CO2: 26 mmol/L (ref 22–32)
CREATININE: 2.66 mg/dL — AB (ref 0.61–1.24)
Chloride: 105 mmol/L (ref 101–111)
GFR calc Af Amer: 26 mL/min — ABNORMAL LOW (ref >60)
GFR calc non Af Amer: 22 mL/min — ABNORMAL LOW (ref >60)
GLUCOSE: 121 mg/dL — AB (ref 65–99)
Potassium: 3.9 mmol/L (ref 3.5–5.1)
Sodium: 140 mmol/L (ref 135–145)

## 2016-05-01 LAB — RENAL FUNCTION PANEL
ALBUMIN: 2.2 g/dL — AB (ref 3.5–5.0)
ANION GAP: 11 (ref 5–15)
Albumin: 2.4 g/dL — ABNORMAL LOW (ref 3.5–5.0)
Anion gap: 5 (ref 5–15)
BUN: 31 mg/dL — ABNORMAL HIGH (ref 6–20)
BUN: 19 mg/dL (ref 6–20)
CHLORIDE: 103 mmol/L (ref 101–111)
CO2: 24 mmol/L (ref 22–32)
CO2: 28 mmol/L (ref 22–32)
CREATININE: 1.64 mg/dL — AB (ref 0.61–1.24)
Calcium: 7.4 mg/dL — ABNORMAL LOW (ref 8.9–10.3)
Calcium: 7.6 mg/dL — ABNORMAL LOW (ref 8.9–10.3)
Chloride: 105 mmol/L (ref 101–111)
Creatinine, Ser: 2.3 mg/dL — ABNORMAL HIGH (ref 0.61–1.24)
GFR calc Af Amer: 31 mL/min — ABNORMAL LOW (ref >60)
GFR calc Af Amer: 47 mL/min — ABNORMAL LOW (ref >60)
GFR calc non Af Amer: 27 mL/min — ABNORMAL LOW (ref >60)
GFR, EST NON AFRICAN AMERICAN: 40 mL/min — AB (ref >60)
GLUCOSE: 159 mg/dL — AB (ref 65–99)
Glucose, Bld: 118 mg/dL — ABNORMAL HIGH (ref 65–99)
PHOSPHORUS: 2.9 mg/dL (ref 2.5–4.6)
POTASSIUM: 4.2 mmol/L (ref 3.5–5.1)
Phosphorus: 2.8 mg/dL (ref 2.5–4.6)
Potassium: 4.5 mmol/L (ref 3.5–5.1)
SODIUM: 138 mmol/L (ref 135–145)
SODIUM: 138 mmol/L (ref 135–145)

## 2016-05-01 LAB — BLOOD GAS, ARTERIAL
ACID-BASE DEFICIT: 1.5 mmol/L (ref 0.0–2.0)
Bicarbonate: 22.2 mmol/L (ref 20.0–28.0)
DRAWN BY: 36527
FIO2: 70
LHR: 30 {breaths}/min
MECHVT: 530 mL
O2 SAT: 95.5 %
PATIENT TEMPERATURE: 98.1
PEEP/CPAP: 5 cmH2O
PH ART: 7.428 (ref 7.350–7.450)
PO2 ART: 80.1 mmHg — AB (ref 83.0–108.0)
pCO2 arterial: 34.1 mmHg (ref 32.0–48.0)

## 2016-05-01 LAB — LACTIC ACID, PLASMA
Lactic Acid, Venous: 2.3 mmol/L (ref 0.5–1.9)
Lactic Acid, Venous: 2.1 mmol/L (ref 0.5–1.9)

## 2016-05-01 LAB — PROCALCITONIN: Procalcitonin: 33.04 ng/mL

## 2016-05-01 LAB — URINE CULTURE: CULTURE: NO GROWTH

## 2016-05-01 LAB — PARATHYROID HORMONE, INTACT (NO CA): PTH: 218 pg/mL — AB (ref 15–65)

## 2016-05-01 LAB — PROTEIN ELECTROPHORESIS, SERUM
A/G RATIO SPE: 1.7 (ref 0.7–1.7)
ALPHA-1-GLOBULIN: 0.1 g/dL (ref 0.0–0.4)
ALPHA-2-GLOBULIN: 0.5 g/dL (ref 0.4–1.0)
Albumin ELP: 2.5 g/dL — ABNORMAL LOW (ref 2.9–4.4)
Beta Globulin: 0.5 g/dL — ABNORMAL LOW (ref 0.7–1.3)
GLOBULIN, TOTAL: 1.5 g/dL — AB (ref 2.2–3.9)
Gamma Globulin: 0.4 g/dL (ref 0.4–1.8)
Total Protein ELP: 4 g/dL — ABNORMAL LOW (ref 6.0–8.5)

## 2016-05-01 LAB — POCT ACTIVATED CLOTTING TIME
ACTIVATED CLOTTING TIME: 224 s
ACTIVATED CLOTTING TIME: 230 s
ACTIVATED CLOTTING TIME: 224 s
ACTIVATED CLOTTING TIME: 219 s
Activated Clotting Time: 235 seconds
Activated Clotting Time: 241 seconds
Activated Clotting Time: 241 seconds
Activated Clotting Time: 241 seconds
Activated Clotting Time: 219 seconds

## 2016-05-01 LAB — CBC
HEMATOCRIT: 29.6 % — AB (ref 39.0–52.0)
HEMOGLOBIN: 10.2 g/dL — AB (ref 13.0–17.0)
MCH: 29.1 pg (ref 26.0–34.0)
MCHC: 34.5 g/dL (ref 30.0–36.0)
MCV: 84.6 fL (ref 78.0–100.0)
Platelets: 154 10*3/uL (ref 150–400)
RBC: 3.5 MIL/uL — AB (ref 4.22–5.81)
RDW: 13.5 % (ref 11.5–15.5)
WBC: 24.5 10*3/uL — ABNORMAL HIGH (ref 4.0–10.5)

## 2016-05-01 LAB — APTT

## 2016-05-01 LAB — HEPATITIS B SURFACE ANTIBODY,QUALITATIVE: HEP B S AB: NONREACTIVE

## 2016-05-01 LAB — HCV COMMENT:

## 2016-05-01 LAB — MAGNESIUM
Magnesium: 2 mg/dL (ref 1.7–2.4)
Magnesium: 2.1 mg/dL (ref 1.7–2.4)

## 2016-05-01 LAB — PHOSPHORUS: PHOSPHORUS: 2.9 mg/dL (ref 2.5–4.6)

## 2016-05-01 LAB — HEPATITIS B SURFACE ANTIGEN: HEP B S AG: NEGATIVE

## 2016-05-01 LAB — HEPATITIS B CORE ANTIBODY, IGM: Hep B C IgM: NEGATIVE

## 2016-05-01 LAB — HEPATITIS C ANTIBODY (REFLEX): HCV Ab: <0.1 s/co ratio (ref 0.0–0.9)

## 2016-05-01 MED ORDER — VITAL HIGH PROTEIN PO LIQD
1000.0000 mL | ORAL | Status: DC
  Administered 2016-05-02 – 2016-05-04 (×4): 1000 mL
  Filled 2016-05-01 (×4): qty 1000

## 2016-05-01 MED ORDER — PRO-STAT SUGAR FREE PO LIQD
30.0000 mL | Freq: Two times a day (BID) | ORAL | Status: DC
  Filled 2016-05-01: qty 30

## 2016-05-01 MED ORDER — NOREPINEPHRINE BITARTRATE 1 MG/ML IV SOLN
2.0000 ug/min | INTRAVENOUS | Status: DC
  Administered 2016-05-01: 20 ug/min via INTRAVENOUS
  Filled 2016-05-01 (×3): qty 16

## 2016-05-01 MED ORDER — FAMOTIDINE IN NACL 20-0.9 MG/50ML-% IV SOLN
20.0000 mg | INTRAVENOUS | Status: DC
  Administered 2016-05-02: 20 mg via INTRAVENOUS
  Filled 2016-05-01: qty 50

## 2016-05-01 MED ORDER — VANCOMYCIN HCL IN DEXTROSE 750-5 MG/150ML-% IV SOLN
750.0000 mg | INTRAVENOUS | Status: DC
  Administered 2016-05-02: 750 mg via INTRAVENOUS
  Filled 2016-05-01: qty 150

## 2016-05-01 MED ORDER — VITAL HIGH PROTEIN PO LIQD: 1000.0000 mL | ORAL | Status: DC

## 2016-05-01 NOTE — Progress Notes (Addendum)
PULMONARY / CRITICAL CARE MEDICINE   Name: Ross Daniel MRN: [id number] DOB: [birth year]    ADMISSION DATE:  04/30/2016 CONSULTATION DATE:  04/30/16  REFERRING MD:  [facility]  CHIEF COMPLAINT:  Pneumonia, sepsis, AKI  HISTORY OF PRESENT ILLNESS:   [name] is a 75 year old with possible history of diabetes, hypertension. He was found unresponsive in his underwear outside his house by neightbours. He was brought to [facility] ED where he was found to have pneumonia, sepsis with severe anion Metabolic acidosis, lactic acidosis, AKI (labs below). He has been transferred to [facility] ED for further evaluation.    SUBJECTIVE:  Intaubated  VITAL SIGNS: BP 114/62   Pulse (!) 104   Temp 97.4 F (36.3 C) (Oral)   Resp (!) 30   Ht 5\' 7"  (1.702 m)   Wt 142 lb 6.7 oz (64.6 kg)   SpO2 100%   BMI 22.31 kg/m   HEMODYNAMICS: CVP:  [2 mmHg-6 mmHg] 5 mmHg  VENTILATOR SETTINGS: Vent Mode: PRVC FiO2 (%):  [60 %-70 %] 60 % Set Rate:  [30 bmp] 30 bmp Vt Set:  [530 mL] 530 mL PEEP:  [5 cmH20] 5 cmH20 Plateau Pressure:  [13 cmH20-19 cmH20] 19 cmH20  INTAKE / OUTPUT: I/O last 3 completed shifts: In: 9796.1 [I.V.:8296.1; IV Piggyback:1500] Out: 4098 [Urine:885; Other:4778]  PHYSICAL EXAMINATION: General:  Opens eyed but no follows commands Neuro:  Moves all four extremities,to pain HEENT:  PERRL EOMI Cardiovascular:  Tachycardia, No MRG Lungs:  Clear, No wheeze, crackles Abdomen:  Soft, + BS Musculoskeletal:  Normal tone and bulk Skin:  Intact  LABS:  BMET  Recent Labs Lab 04/30/16 2122 05/01/16 0052 05/01/16 0430  NA 139 140 138  K 3.7 3.9 4.2  CL 104 105 103  CO2 25 26 24   BUN 52* 37* 31*  CREATININE 3.32* 2.66* 2.30*  GLUCOSE 210* 121* 159*    Electrolytes  Recent Labs Lab 04/30/16 0646 04/30/16 1615 04/30/16 2122 05/01/16 0052 05/01/16 0430  CALCIUM 7.5* 7.2* 7.3* 7.4* 7.4*  MG  --   --   --   --  2.0  PHOS 7.3* 3.9  --   --  2.8    CBC  Recent Labs Lab  04/30/16 0535 04/30/16 0556 04/30/16 0646 05/01/16 0430  WBC 6.1  --  5.9 24.5*  HGB 9.6* 8.8* 9.9* 10.2*  HCT 28.4* 26.0* 29.7* 29.6*  PLT 205  --  202 154    Coag's  Recent Labs Lab 05/01/16 0430  APTT >200*    Sepsis Markers  Recent Labs Lab 04/30/16 0432 04/30/16 0535 04/30/16 0548  LATICACIDVEN 3.5*  --  3.0*  PROCALCITON  --  28.75  --     ABG  Recent Labs Lab 04/30/16 0524 04/30/16 0938 05/01/16 0435  PHART 7.182* 7.393 7.428  PCO2ART 23.4* 27.2* 34.1  PO2ART 68.0* 166* 80.1*    Liver Enzymes  Recent Labs Lab 04/30/16 0646 04/30/16 1615 05/01/16 0430  AST 21  --   --   ALT 12*  --   --   ALKPHOS 43  --   --   BILITOT 0.7  --   --   ALBUMIN 2.2* 2.2* 2.4*    Cardiac Enzymes  Recent Labs Lab 04/30/16 1111 04/30/16 1615 04/30/16 2122  TROPONINI 0.12* 0.41* 0.40*    Glucose  Recent Labs Lab 04/30/16 2309 04/30/16 2356 05/01/16 0109 05/01/16 0206 05/01/16 0309 05/01/16 0755  GLUCAP 148* 127* 90 113* 129* 130*    Imaging US  Renal  Result Date: 04/30/2016 CLINICAL DATA:  Acute kidney injury. EXAM: RENAL / URINARY TRACT ULTRASOUND COMPLETE COMPARISON:  None. FINDINGS: Right Kidney: Length: 9.9 cm. Echogenicity within normal limits. No mass or hydronephrosis visualized. Left Kidney: Length: 10.4 cm. 11 mm cyst in the midpole. Echogenicity within normal limits. No mass or hydronephrosis visualized. Bladder: Decompressed with Foley catheter in place. IMPRESSION: No acute findings. Electronically Signed   By: Charlett Nose M.D.   On: 04/30/2016 11:55   Dg Chest Port 1 View  Result Date: 05/01/2016 CLINICAL DATA:  Respiratory failure. EXAM: PORTABLE CHEST 1 VIEW COMPARISON:  04/30/2016. FINDINGS: Endotracheal tube, left IJ line in stable position. NG tube has been advanced, its tip in side hole oral well below the left hemidiaphragm. Heart size normal. Bibasilar pulmonary infiltrates, left side greater than right again noted,. No interim  change. Bilateral pneumonia could present in this fashion. Small left pleural effusion again noted. No pneumothorax. IMPRESSION: 1. Interim advancement of NG tube, its tip and side hole are well below the left hemidiaphragm. Endotracheal tube and left IJ line in stable position . 2. Persistent unchanged bibasilar infiltrates, particularly from the left lung base. Bilateral pneumonia could present this fashion. Persistent left pleural effusion. Electronically Signed   By: Maisie Fus  Register   On: 05/01/2016 07:08    STUDIES:  Labs studies from [facility] Ph 7.046/13/94/94% Bicarb 3 Anion gap 31 Lactic acid 4.1 WBC 28 BUN/Cr 190/13.4 K 7.8 Tylenol, alcohol, salicylate- Neg, UDS- Neg  CT head 04/29/16- No acute findings CT chest abdomen pelvis 04/29/16-Airspace consolidation in the left lower lobe consistent with acute pneumonia. Similar findings lesser degree on the right. Severe bilateral pan-lobar emphysema. No acute findings in the abdomen, pelvis.  CULTURES: Bcx X 2 9/6>  Ucx 9/6 >neg Sputum Cx 9/6 >  ANTIBIOTICS: Vanco 9/6> Zosyn 9/6> Azithro 9/6>  SIGNIFICANT EVENTS: 9/6- Admit with sepsis, DKA, AKI 9/6 intubated, cvvhd 9/7- levo remains  LINES/TUBES:  DISCUSSION: 75 year old with diabetes, hypertension admitted with severe sepsis, multilobar pneumonia, possible aspiration in the setting of acute kidney injury, lactic acidosis, hyperkalemia. Intubated 9/6.  ASSESSMENT / PLAN:  PULMONARY A: Pneumonia, aspiration P:   Vent bundle Continue supplemental O2 CXR, daily Abx as above  CARDIOVASCULAR A:  Sepsis, hypotension Got total 4 lt NS so far. P:  Continue fluid resuscitation Even to pos balance  RENAL Lab Results  Component Value Date   CREATININE 2.30 (H) 05/01/2016   CREATININE 2.66 (H) 05/01/2016   CREATININE 3.32 (H) 04/30/2016    Recent Labs Lab 04/30/16 2122 05/01/16 0052 05/01/16 0430  K 3.7 3.9 4.2     A:   AKI Hyperkalemia resolved P:    Now on CVVH per renal  GASTROINTESTINAL A:   Stable P:   Tube feeds needed  HEMATOLOGIC A:   Leukocytosis, sepsis P:  Monitor  INFECTIOUS A:   Multilobar pneumonia P:   Continue vanco, zosyn Add azithro Check urine pneumococcal, legionella Follow Pct and cultures.  ENDOCRINE CBG (last 3)   Recent Labs  05/01/16 0206 05/01/16 0309 05/01/16 0755  GLUCAP 113* 129* 130*     A:   DM DKA P:    DKA protocol, no ketones in urine 9/6, off insulin drip with cbg well controlled ssi/lantus  NEUROLOGIC A:   Altered Encephalopathy likely from sepsis, metabolic derangements P:   Monitor neuro status fent  FAMILY  - Updates: No family at bedside. - Inter-disciplinary family meet or Palliative Care meeting due by:  9/6.  Brett CanalesSteve Minor ACNP [facility] Pager [phone] till 3 pm If no answer page [phone] 05/01/2016, 11:10 AM   STAFF NOTE: I, Rory Percyaniel Ubah Radke, MD FACP have personally reviewed patient's available data, including medical history, events of note, physical examination and test results as part of my evaluation. I have discussed with resident/NP and other care providers such as pharmacist, RN and RRT. In addition, I personally evaluated patient and elicited key findings of: rass -1, FC yes, left ronchi, cvvhd remains, Left ll pna present, no urine output, keep cvvhd, keep even to positive with balance,  Maintain current abx and dose adjust vanc up, ABG reviewed, rate reduction  Planned, likely can SBT this afternoon, no extubation planned, WUA fent, avoid benzo, of insulin drip, saline to maintain fo r pos balance, follow wbc trend, map goal 65 is goal, renal US neg The patient is critically ill with multiple organ systems failure and requires high complexity decision making for assessment and support, frequent evaluation and titration of therapies, application of advanced monitoring technologies and extensive interpretation of multiple databases.    Critical Care Time devoted to patient care services described in this note is 40 Minutes. This time reflects time of care of this signee: Rory Percyaniel Takyia Sindt, MD FACP. This critical care time does not reflect procedure time, or teaching time or supervisory time of PA/NP/Med student/Med Resident etc but could involve care discussion time. Rest per NP/medical resident whose note is outlined above and that I agree with   Mcarthur Rossettianiel J. Tyson AliasFeinstein, MD, FACP Pgr: [phone] [place] [facility] 05/01/2016 12:10 PM

## 2016-05-01 NOTE — Progress Notes (Signed)
Pharmacy Antibiotic Note  Ross Daniel is a 75 y.o. male admitted from outside hospital on 04/30/2016, where he received one dose of zosyn for ARF/PNA/sepsis. Pharmacy has been consulted for Vancomycin and zosyn dosing. He is currently on CVVHDF with no interruptions. VSS, but WBC increased from 5.9 to 24.5 overnight. Procalcitonin 28.75. Recommended dose of vancomycin is between 500mg  and 750mg  every 24 hours. Will escalate dose in setting of leukocytosis and elevated procalcitonin.   Plan: Vancomycin 750 mg IV every 24 hours.  Goal trough 15-20 mcg/mL. Zosyn 3.375 g IV every 6 hours  Monitor c/s, clinical signs of improvement, trough at ss as needed  Height: 5\' 7"  (170.2 cm) Weight: 142 lb 6.7 oz (64.6 kg) IBW/kg (Calculated) : 66.1  Temp (24hrs), Avg:98 F (36.7 C), Min:97.4 F (36.3 C), Max:99 F (37.2 C)   Recent Labs Lab 04/30/16 0432 04/30/16 0535 04/30/16 0548  04/30/16 0646 04/30/16 1615 04/30/16 2122 05/01/16 0052 05/01/16 0430  WBC  --  6.1  --   --  5.9  --   --   --  24.5*  CREATININE  --  10.39*  --   < > 10.11* 4.47* 3.32* 2.66* 2.30*  LATICACIDVEN 3.5*  --  3.0*  --   --   --   --   --   --   < > = values in this interval not displayed.  Estimated Creatinine Clearance: 26.5 mL/min (by C-G formula based on SCr of 2.3 mg/dL).    No Known Allergies  Antimicrobials this admission: Zosyn 9/6 >> Vanc 9/6 >>   Dose adjustments this admission: Vancomycin 750 mg IV q24h   Microbiology results: 9/6 BCx: In Process 9/6 UCx: NG  9/6 MRSA PCR: Negative  Imaging: 9/7 CXR: Persistent unchanged bibasilar infiltrates, particularly from the left lung base. Bilateral pneumonia could present this fashion. Persistent left pleural effusion.  Thank you for allowing pharmacy to be a part of this patient's care.  Alfredo BachJoseph Mykenna Viele, Cleotis NipperBS, PharmD Clinical Pharmacy Resident [phone] (Pager) 05/01/2016 12:28 PM

## 2016-05-01 NOTE — Care Management Note (Addendum)
Case Management Note  Patient Details  Name: Ross Daniel MRN: [id number] Date of Birth: 1941/08/13  Subjective/Objective:    Pt admitted after being found wandering around outside with altered mental status.  [facility]                Action/Plan:  PTA pt independent alone from home.  Neighbor found pt and contacted emergency services.  Per neighbor ; pts wife is deceased and never had children - no known family.  Pt is currently intubated, on pressors and CRRT.  CM called pharmacy located on prescription bottles - CM was informed by pharmacy ([facility] [place]) that pt does not have insurance listed - pharmacy provided CM with prescribing provider; Dr Greggory Stallionacheal Hyler at [phone].  CM called number and was informed [facility] - CM left voicemail requesting call back to inform of pt admit.  CSW consulted to help find next of kin    Expected Discharge Date:                  Expected Discharge Plan:     In-House Referral:     Discharge planning Services     Post Acute Care Choice:    Choice offered to:     DME Arranged:    DME Agency:     HH Arranged:    HH Agency:     Status of Service:     If discussed at [employer] Meetings, dates discussed:    Additional Comments:  Cherylann ParrClaxton, Elia Keenum S, RN 05/01/2016, 11:36 AM

## 2016-05-01 NOTE — Progress Notes (Signed)
Patient ID: Ross Daniel, male   DOB: Sep 22, 1940, 75 y.o.   MRN: [id number] S:No changes overnight other than K concentration in replacement fluids O:BP 119/66   Pulse (!) 108   Temp 97.4 F (36.3 C) (Oral)   Resp (!) 22   Ht 5\' 7"  (1.702 m)   Wt 64.6 kg (142 lb 6.7 oz)   SpO2 98%   BMI 22.31 kg/m   Intake/Output Summary (Last 24 hours) at 05/01/16 0825 Last data filed at 05/01/16 0700  Gross per 24 hour  Intake          7161.06 ml  Output             5043 ml  Net          2118.06 ml   Intake/Output: I/O last 3 completed shifts: In: 9796.1 [I.V.:8296.1; IV Piggyback:1500] Out: 4098 [Urine:885; Other:4778]  Intake/Output this shift:  No intake/output data recorded. Weight change: 3.3 kg (7 lb 4.4 oz) JXB:JYNWGNF WM intubated CVS:no rub Resp:scattered rhonchi AOZ:HYQMVH Ext: no edema   Recent Labs Lab 04/30/16 0535 04/30/16 0556 04/30/16 0646 04/30/16 1615 04/30/16 2122 05/01/16 0052 05/01/16 0430  NA 144 147* 144 142 139 140 138  K 4.3 4.2 4.0 3.5 3.7 3.9 4.2  CL 118* 115* 117* 109 104 105 103  CO2 11*  --  13* 22 25 26 24   GLUCOSE 292* 274* 283* 116* 210* 121* 159*  BUN 157* >140* 160* 75* 52* 37* 31*  CREATININE 10.39* 10.90* 10.11* 4.47* 3.32* 2.66* 2.30*  ALBUMIN  --   --  2.2* 2.2*  --   --  2.4*  CALCIUM 7.4*  --  7.5* 7.2* 7.3* 7.4* 7.4*  PHOS  --   --  7.3* 3.9  --   --  2.8  AST  --   --  21  --   --   --   --   ALT  --   --  12*  --   --   --   --    Liver Function Tests:  Recent Labs Lab 04/30/16 0646 04/30/16 1615 05/01/16 0430  AST 21  --   --   ALT 12*  --   --   ALKPHOS 43  --   --   BILITOT 0.7  --   --   PROT 4.0*  --   --   ALBUMIN 2.2* 2.2* 2.4*   No results for input(s): LIPASE, AMYLASE in the last 168 hours. No results for input(s): AMMONIA in the last 168 hours. CBC:  Recent Labs Lab 04/30/16 0535 04/30/16 0556 04/30/16 0646 05/01/16 0430  WBC 6.1  --  5.9 24.5*  NEUTROABS 5.7  --   --   --   HGB 9.6* 8.8* 9.9*  10.2*  HCT 28.4* 26.0* 29.7* 29.6*  MCV 87.4  --  87.9 84.6  PLT 205  --  202 154   Cardiac Enzymes:  Recent Labs Lab 04/30/16 0535 04/30/16 0646 04/30/16 1111 04/30/16 1615 04/30/16 2122  CKTOTAL  --  199  --   --   --   TROPONINI 0.04* 0.04* 0.12* 0.41* 0.40*   CBG:  Recent Labs Lab 04/30/16 2356 05/01/16 0109 05/01/16 0206 05/01/16 0309 05/01/16 0755  GLUCAP 127* 90 113* 129* 130*    Iron Studies:  Recent Labs  04/30/16 0646  IRON 91  TIBC 179*   Studies/Results: US Renal  Result Date: 04/30/2016 CLINICAL DATA:  Acute kidney injury. EXAM: RENAL / URINARY TRACT  ULTRASOUND COMPLETE COMPARISON:  None. FINDINGS: Right Kidney: Length: 9.9 cm. Echogenicity within normal limits. No mass or hydronephrosis visualized. Left Kidney: Length: 10.4 cm. 11 mm cyst in the midpole. Echogenicity within normal limits. No mass or hydronephrosis visualized. Bladder: Decompressed with Foley catheter in place. IMPRESSION: No acute findings. Electronically Signed   By: Charlett NoseKevin  Dover M.D.   On: 04/30/2016 11:55   Dg Chest Port 1 View  Result Date: 05/01/2016 CLINICAL DATA:  Respiratory failure. EXAM: PORTABLE CHEST 1 VIEW COMPARISON:  04/30/2016. FINDINGS: Endotracheal tube, left IJ line in stable position. NG tube has been advanced, its tip in side hole oral well below the left hemidiaphragm. Heart size normal. Bibasilar pulmonary infiltrates, left side greater than right again noted,. No interim change. Bilateral pneumonia could present in this fashion. Small left pleural effusion again noted. No pneumothorax. IMPRESSION: 1. Interim advancement of NG tube, its tip and side hole are well below the left hemidiaphragm. Endotracheal tube and left IJ line in stable position . 2. Persistent unchanged bibasilar infiltrates, particularly from the left lung base. Bilateral pneumonia could present this fashion. Persistent left pleural effusion. Electronically Signed   By: Maisie Fushomas  Register   On: 05/01/2016  07:08   Dg Chest Port 1 View  Result Date: 04/30/2016 CLINICAL DATA:  Central line  NG tube EXAM: PORTABLE CHEST 1 VIEW COMPARISON:  Earlier today at 456 hours. FINDINGS: 0829 hours. Endotracheal tube terminates 4.0 cm above carina.Left internal jugular line terminates at the mid SVC. Nasogastric tube terminates at the proximal stomach, with side port at or just above the gastroesophageal junction. Normal heart size. Layering left pleural effusion. No pneumothorax. Pulmonary venous congestion is asymmetric, greater on the left. Left greater than right basilar predominant airspace opacities are not significantly changed. IMPRESSION: Nasogastric tube terminates at the proximal stomach with side port at or just above the gastroesophageal junction. Consider advancement approximately a 4-6 cm. Otherwise, similar appearance of the chest with layering left pleural effusion and pulmonary venous congestion. Bibasilar airspace opacities. Favor pneumonia, especially on the left. Electronically Signed   By: Jeronimo GreavesKyle  Talbot M.D.   On: 04/30/2016 08:55   Dg Chest Port 1 View  Result Date: 04/30/2016 CLINICAL DATA:  Hemodialysis catheter placement.  Initial encounter. EXAM: PORTABLE CHEST 1 VIEW COMPARISON:  None. FINDINGS: The patient's hemodialysis catheter noted ending overlying the mid SVC. Bibasilar airspace opacities, left greater than right, likely reflect multifocal pneumonia. Asymmetric pulmonary edema might have a similar appearance. No pleural effusion or pneumothorax is seen. The cardiomediastinal silhouette is normal in size. No acute osseous abnormalities are identified. IMPRESSION: 1. Hemodialysis catheter noted ending overlying the mid SVC. 2. Bibasilar airspace opacities, left greater than right, likely reflect multifocal pneumonia. Asymmetric pulmonary edema might have a similar appearance. Electronically Signed   By: Roanna RaiderJeffery  Chang M.D.   On: 04/30/2016 05:05   . azithromycin  500 mg Intravenous Daily  .  chlorhexidine  15 mL Mouth Rinse BID  . famotidine (PEPCID) IV  20 mg Intravenous Q12H  . heparin  5,000 Units Subcutaneous Q8H  . insulin aspart  1-3 Units Subcutaneous Q4H  . insulin glargine  25 Units Subcutaneous Q24H  . mouth rinse  15 mL Mouth Rinse QID  . piperacillin-tazobactam  3.375 g Intravenous Q6H  . vancomycin  500 mg Intravenous Q24H    BMET    Component Value Date/Time   NA 138 05/01/2016 0430   K 4.2 05/01/2016 0430   CL 103 05/01/2016 0430   CO2  24 05/01/2016 0430   GLUCOSE 159 (H) 05/01/2016 0430   BUN 31 (H) 05/01/2016 0430   CREATININE 2.30 (H) 05/01/2016 0430   CALCIUM 7.4 (L) 05/01/2016 0430   GFRNONAA 27 (L) 05/01/2016 0430   GFRAA 31 (L) 05/01/2016 0430   CBC    Component Value Date/Time   WBC 24.5 (H) 05/01/2016 0430   RBC 3.50 (L) 05/01/2016 0430   HGB 10.2 (L) 05/01/2016 0430   HCT 29.6 (L) 05/01/2016 0430   PLT 154 05/01/2016 0430   MCV 84.6 05/01/2016 0430   MCH 29.1 05/01/2016 0430   MCHC 34.5 05/01/2016 0430   RDW 13.5 05/01/2016 0430   LYMPHSABS 0.3 (L) 04/30/2016 0535   MONOABS 0.2 04/30/2016 0535   EOSABS 0.0 04/30/2016 0535   BASOSABS 0.0 04/30/2016 0535    Pt was found down in his underwear by neighbors and transferred from [facility] ED to [facility] for pneumonia, sepsis, and AKI with severe metabolic and lactic acidosis.  +history of NSAIDS/lisinopril/metformen prior to admission  Assessment/Plan:  1. AKI non-oliguric- presumably due to ischemic ATN in setting of low BP and concomitant ACE inhibition, NSAIDS, and metformin.  CVVHD initiated 04/30/16 1. Continue CVVHD with 4K/2.5Ca dialysate and replacement fluids 2. Heparin on hold due to elevated PTT 2. VDRF- per PCCM 3. AMS- per primary 4. Pneumonia- abx per PCCM on sepsis protocol 5. Anemia- continue to follow. 6. Metabolic acidosis due to #1 and metformin.  Improved with CVVHD 7. F/E/N- initially hyperkalemic then hypo.  Stable on 4K bath.  Julien Nordmann Sydny Schnitzler

## 2016-05-01 NOTE — Progress Notes (Signed)
MEDICATION RELATED  NOTE   Pharmacy Re: Home Medications    Patient apparently manages his own medications prior to admit.  The above list was obtained from the chart records that was sent [facility].  We cannot confirm that he is indeed taking these medications.  Will mark his med-list status as complete.  Please resume those medications that you feel are most appropriate for him.  If we find out any additional information, we will add and contact provider.   Nadara MustardNita Trezure Cronk, PharmD., MS Clinical Pharmacist Pager:  [phone] Thank you for allowing pharmacy to be part of this patients care team. 05/01/2016,9:57 AM

## 2016-05-01 NOTE — Progress Notes (Signed)
Initial Nutrition Assessment  DOCUMENTATION CODES:   Not applicable  INTERVENTION:    Initiate TF via OGT with Vital High Protein at goal rate of 70 ml/h (1680 ml per day) to provide 1680 kcals, 147 gm protein, 1404 ml free water daily.  NUTRITION DIAGNOSIS:   Inadequate oral intake related to inability to eat as evidenced by NPO status.  GOAL:   Patient will meet greater than or equal to 90% of their needs  MONITOR:   Vent status, Labs, Weight trends, TF tolerance, I & O's  REASON FOR ASSESSMENT:   Consult Enteral/tube feeding initiation and management  ASSESSMENT:   75 year old with possible history of diabetes, hypertension. He was found unresponsive in his underwear outside his house by neightbours. [facility] where he was found to have pneumonia, sepsis with severe anion metabolic acidosis, lactic acidosis, AKI (labs below). [facility].   Discussed patient in ICU rounds and with RN today. Patient is receiving CRRT. Received MD Consult for TF initiation and management. Labs reviewed: potassium, phosphorus, and magnesium WNL. Medications reviewed and include Novolog, Lantus. Nutrition-Focused physical exam completed. Findings are no fat depletion, mild-moderate muscle depletion, and no edema.  Patient is currently intubated on ventilator support MV: 12.3 L/min Temp (24hrs), Avg:98 F (36.7 C), Min:97.4 F (36.3 C), Max:99 F (37.2 C)   Diet Order:  Diet NPO time specified  Skin:  Reviewed, no issues  Last BM:  9/6  Height:   Ht Readings from Last 1 Encounters:  04/30/16 5\' 7"  (1.702 m)    Weight:   Wt Readings from Last 1 Encounters:  05/01/16 142 lb 6.7 oz (64.6 kg)    Ideal Body Weight:  67.3 kg  BMI:  Body mass index is 22.31 kg/m.  Estimated Nutritional Needs:   Kcal:  1687  Protein:  125-150 gm  Fluid:  2 L  EDUCATION NEEDS:   No education needs identified at this  time  Joaquin CourtsKimberly Shaunda Tipping, RD, LDN, CNSC Pager [phone] After Hours Pager [phone]

## 2016-05-01 NOTE — Progress Notes (Signed)
PTT > 200, notified Dr. Marisue HumbleSanford of critical value.  Heparin  for CRRT on hold.

## 2016-05-02 ENCOUNTER — Inpatient Hospital Stay (HOSPITAL_COMMUNITY): Payer: Non-veteran care

## 2016-05-02 ENCOUNTER — Other Ambulatory Visit (HOSPITAL_COMMUNITY): Payer: Self-pay

## 2016-05-02 ENCOUNTER — Encounter (HOSPITAL_COMMUNITY): Payer: Self-pay | Admitting: General Practice

## 2016-05-02 LAB — BLOOD GAS, ARTERIAL
ACID-BASE EXCESS: 1.7 mmol/L (ref 0.0–2.0)
BICARBONATE: 26.1 mmol/L (ref 20.0–28.0)
DRAWN BY: 252031
FIO2: 40
LHR: 24 {breaths}/min
MECHVT: 530 mL
O2 Saturation: 89.6 %
PEEP: 5 cmH2O
PO2 ART: 57.1 mmHg — AB (ref 83.0–108.0)
Patient temperature: 97.5
pCO2 arterial: 42.1 mmHg (ref 32.0–48.0)
pH, Arterial: 7.405 (ref 7.350–7.450)

## 2016-05-02 LAB — PROCALCITONIN: Procalcitonin: 21.95 ng/mL

## 2016-05-02 LAB — MAGNESIUM
MAGNESIUM: 2.1 mg/dL (ref 1.7–2.4)
MAGNESIUM: 2.3 mg/dL (ref 1.7–2.4)
Magnesium: 2.2 mg/dL (ref 1.7–2.4)

## 2016-05-02 LAB — CBC
HCT: 24.6 % — ABNORMAL LOW (ref 39.0–52.0)
Hemoglobin: 8.4 g/dL — ABNORMAL LOW (ref 13.0–17.0)
MCH: 29.3 pg (ref 26.0–34.0)
MCHC: 34.1 g/dL (ref 30.0–36.0)
MCV: 85.7 fL (ref 78.0–100.0)
PLATELETS: 101 10*3/uL — AB (ref 150–400)
RBC: 2.87 MIL/uL — AB (ref 4.22–5.81)
RDW: 14.2 % (ref 11.5–15.5)
WBC: 22.4 10*3/uL — ABNORMAL HIGH (ref 4.0–10.5)

## 2016-05-02 LAB — GLUCOSE, CAPILLARY
GLUCOSE-CAPILLARY: 111 mg/dL — AB (ref 65–99)
GLUCOSE-CAPILLARY: 157 mg/dL — AB (ref 65–99)
Glucose-Capillary: 138 mg/dL — ABNORMAL HIGH (ref 65–99)
Glucose-Capillary: 121 mg/dL — ABNORMAL HIGH (ref 65–99)
Glucose-Capillary: 186 mg/dL — ABNORMAL HIGH (ref 65–99)
Glucose-Capillary: 161 mg/dL — ABNORMAL HIGH (ref 65–99)

## 2016-05-02 LAB — RENAL FUNCTION PANEL
ALBUMIN: 2.2 g/dL — AB (ref 3.5–5.0)
ALBUMIN: 2.1 g/dL — AB (ref 3.5–5.0)
ANION GAP: 5 (ref 5–15)
Anion gap: 5 (ref 5–15)
BUN: 14 mg/dL (ref 6–20)
BUN: 13 mg/dL (ref 6–20)
CHLORIDE: 104 mmol/L (ref 101–111)
CO2: 27 mmol/L (ref 22–32)
CO2: 27 mmol/L (ref 22–32)
CREATININE: 1.12 mg/dL (ref 0.61–1.24)
Calcium: 7.7 mg/dL — ABNORMAL LOW (ref 8.9–10.3)
Calcium: 7.6 mg/dL — ABNORMAL LOW (ref 8.9–10.3)
Chloride: 103 mmol/L (ref 101–111)
Creatinine, Ser: 1.42 mg/dL — ABNORMAL HIGH (ref 0.61–1.24)
GFR calc Af Amer: 55 mL/min — ABNORMAL LOW (ref >60)
GFR calc non Af Amer: 48 mL/min — ABNORMAL LOW (ref >60)
GLUCOSE: 153 mg/dL — AB (ref 65–99)
Glucose, Bld: 193 mg/dL — ABNORMAL HIGH (ref 65–99)
PHOSPHORUS: 2.6 mg/dL (ref 2.5–4.6)
PHOSPHORUS: 1.9 mg/dL — AB (ref 2.5–4.6)
POTASSIUM: 4.6 mmol/L (ref 3.5–5.1)
Potassium: 4.3 mmol/L (ref 3.5–5.1)
Sodium: 136 mmol/L (ref 135–145)
Sodium: 135 mmol/L (ref 135–145)

## 2016-05-02 LAB — BASIC METABOLIC PANEL
ANION GAP: 7 (ref 5–15)
BUN: 14 mg/dL (ref 6–20)
CALCIUM: 7.7 mg/dL — AB (ref 8.9–10.3)
CO2: 24 mmol/L (ref 22–32)
Chloride: 104 mmol/L (ref 101–111)
Creatinine, Ser: 1.36 mg/dL — ABNORMAL HIGH (ref 0.61–1.24)
GFR calc Af Amer: 58 mL/min — ABNORMAL LOW (ref >60)
GFR calc non Af Amer: 50 mL/min — ABNORMAL LOW (ref >60)
GLUCOSE: 152 mg/dL — AB (ref 65–99)
POTASSIUM: 4.6 mmol/L (ref 3.5–5.1)
Sodium: 135 mmol/L (ref 135–145)

## 2016-05-02 LAB — LEGIONELLA PNEUMOPHILA SEROGP 1 UR AG: L. PNEUMOPHILA SEROGP 1 UR AG: NEGATIVE

## 2016-05-02 LAB — PHOSPHORUS
PHOSPHORUS: 2.6 mg/dL (ref 2.5–4.6)
PHOSPHORUS: 1.9 mg/dL — AB (ref 2.5–4.6)
Phosphorus: 3.1 mg/dL (ref 2.5–4.6)

## 2016-05-02 LAB — APTT: APTT: 46 s — AB (ref 24–36)

## 2016-05-02 MED ORDER — DOCUSATE SODIUM 50 MG/5ML PO LIQD
100.0000 mg | Freq: Two times a day (BID) | ORAL | Status: DC
  Administered 2016-05-02 – 2016-05-05 (×6): 100 mg
  Filled 2016-05-02 (×9): qty 10

## 2016-05-02 MED ORDER — LEVOFLOXACIN IN D5W 250 MG/50ML IV SOLN
250.0000 mg | INTRAVENOUS | Status: DC
  Administered 2016-05-03: 250 mg via INTRAVENOUS
  Filled 2016-05-02 (×2): qty 50

## 2016-05-02 MED ORDER — INSULIN GLARGINE 100 UNIT/ML ~~LOC~~ SOLN
25.0000 [IU] | SUBCUTANEOUS | Status: DC
Start: 2016-05-02 — End: 2016-05-06
  Administered 2016-05-02 – 2016-05-05 (×4): 25 [IU] via SUBCUTANEOUS
  Filled 2016-05-02 (×5): qty 0.25

## 2016-05-02 MED ORDER — DOCUSATE SODIUM 100 MG PO CAPS
100.0000 mg | ORAL_CAPSULE | Freq: Two times a day (BID) | ORAL | Status: DC
  Filled 2016-05-02: qty 1

## 2016-05-02 MED ORDER — INSULIN ASPART 100 UNIT/ML ~~LOC~~ SOLN
1.0000 [IU] | SUBCUTANEOUS | Status: DC
  Administered 2016-05-02 (×3): 2 [IU] via SUBCUTANEOUS
  Administered 2016-05-03: 1 [IU] via SUBCUTANEOUS
  Administered 2016-05-03: 2 [IU] via SUBCUTANEOUS
  Administered 2016-05-03: 1 [IU] via SUBCUTANEOUS
  Administered 2016-05-03: 2 [IU] via SUBCUTANEOUS
  Administered 2016-05-03: 1 [IU] via SUBCUTANEOUS
  Administered 2016-05-04: 3 [IU] via SUBCUTANEOUS
  Administered 2016-05-04: 2 [IU] via SUBCUTANEOUS
  Administered 2016-05-04: 1 [IU] via SUBCUTANEOUS

## 2016-05-02 MED ORDER — LEVOFLOXACIN IN D5W 500 MG/100ML IV SOLN
500.0000 mg | Freq: Once | INTRAVENOUS | Status: AC
  Administered 2016-05-02: 500 mg via INTRAVENOUS
  Filled 2016-05-02: qty 100

## 2016-05-02 MED ORDER — PANTOPRAZOLE SODIUM 40 MG PO PACK
40.0000 mg | PACK | Freq: Every day | ORAL | Status: DC
  Administered 2016-05-02 – 2016-05-05 (×4): 40 mg
  Filled 2016-05-02 (×5): qty 20

## 2016-05-02 NOTE — Care Management Note (Signed)
Case Management Note  Patient Details  Name: Ross CanterburyDonnie Biela MRN: [id number] Date of Birth: [birth year]  Subjective/Objective:    Pt admitted after being found wandering around outside with altered mental status.  [facility]                Action/Plan:  PTA pt independent alone from home.  Neighbor found pt and contacted emergency services.  Per neighbor ; pts wife is deceased and never had children - no known family.  Pt is currently intubated, on pressors and CRRT.  CM called pharmacy located on prescription bottles - CM was informed by pharmacy ([facility] [place]) that pt does not have insurance listed - pharmacy provided CM with prescribing provider; Dr Greggory Stallionacheal Hyler at [phone].  CM called number and was informed the [facility] - CM left voicemail requesting call back to inform of pt admit.  CSW consulted to help find next of kin    Expected Discharge Date:                  Expected Discharge Plan:     In-House Referral:     Discharge planning Services     Post Acute Care Choice:    Choice offered to:     DME Arranged:    DME Agency:     HH Arranged:    HH Agency:     Status of Service:     If discussed at [employer] Meetings, dates discussed:    Additional Comments: 05/02/2016 Pt remains on ventilator with projected extubation within the next 48 hours.  Pt remains on continuous CRRT. Cherylann ParrClaxton, Dmiyah Liscano S, RN 05/02/2016, 12:04 PM

## 2016-05-02 NOTE — Progress Notes (Signed)
PULMONARY / CRITICAL CARE MEDICINE   Name: Jeffrey Graefe MRN: [id number] DOB: October 29, 1940    ADMISSION DATE:  04/30/2016 CONSULTATION DATE:  04/30/16  REFERRING MD:  [facility]  CHIEF COMPLAINT:  Pneumonia, sepsis, AKI  HISTORY OF PRESENT ILLNESS:   [name] is a 75 year old with possible history of diabetes, hypertension. He was found unresponsive in his underwear outside his house by neightbours. He was brought to [facility] ED where he was found to have pneumonia, sepsis with severe anion Metabolic acidosis, lactic acidosis, AKI (labs below). [facility].    SUBJECTIVE:  ccvhd continued, levo off  VITAL SIGNS: BP (!) 103/48   Pulse 95   Temp 97.4 F (36.3 C) (Oral)   Resp (!) 21   Ht 5\' 7"  (1.702 m)   Wt 66.1 kg (145 lb 11.6 oz)   SpO2 99%   BMI 22.82 kg/m   HEMODYNAMICS: CVP:  [4 mmHg] 4 mmHg  VENTILATOR SETTINGS: Vent Mode: PSV;CPAP FiO2 (%):  [40 %-60 %] 40 % Set Rate:  [24 bmp] 24 bmp Vt Set:  [530 mL] 530 mL PEEP:  [5 cmH20] 5 cmH20 Pressure Support:  [8 cmH20] 8 cmH20 Plateau Pressure:  [15 cmH20-20 cmH20] 15 cmH20  INTAKE / OUTPUT: I/O last 3 completed shifts: In: 5671.6 [I.V.:4271.6; IV Piggyback:1400] Out: 4628 [Urine:60; Other:4568]  PHYSICAL EXAMINATION: General:  Opens eyed and does follows commands Neuro:  rass -1, follows commands HEENT:  PERRL EOMI Cardiovascular:  s1 s2 rrr, No MRG Lungs:  Clear Abdomen:  Soft, + BS, no r/g Musculoskeletal:  Normal tone and bulk Skin:  Intact  LABS:  BMET  Recent Labs Lab 05/01/16 0430 05/01/16 1600 05/02/16 0414  NA 138 138 135  136  K 4.2 4.5 4.6  4.6  CL 103 105 104  104  CO2 24 28 24  27   BUN 31* 19 14  14   CREATININE 2.30* 1.64* 1.36*  1.42*  GLUCOSE 159* 118* 152*  153*    Electrolytes  Recent Labs Lab 05/01/16 0430 05/01/16 1216 05/01/16 1600 05/01/16 1618 05/02/16 0414  CALCIUM 7.4*  --  7.6*  --  7.7*  7.7*  MG 2.0 2.1   --  2.1 2.2  PHOS 2.8 2.9 2.9 3.1 2.6  2.6    CBC  Recent Labs Lab 04/30/16 0646 05/01/16 0430 05/02/16 0414  WBC 5.9 24.5* 22.4*  HGB 9.9* 10.2* 8.4*  HCT 29.7* 29.6* 24.6*  PLT 202 154 101*    Coag's  Recent Labs Lab 05/01/16 0430 05/02/16 0414  APTT >200* 46*    Sepsis Markers  Recent Labs Lab 04/30/16 0535 04/30/16 0548 05/01/16 1116 05/01/16 1216 05/01/16 2043 05/02/16 0414  LATICACIDVEN  --  3.0*  --  2.3* 2.1*  --   PROCALCITON 28.75  --  33.04  --   --  21.95    ABG  Recent Labs Lab 04/30/16 0938 05/01/16 0435 05/02/16 0313  PHART 7.393 7.428 7.405  PCO2ART 27.2* 34.1 42.1  PO2ART 166* 80.1* 57.1*    Liver Enzymes  Recent Labs Lab 04/30/16 0646  05/01/16 0430 05/01/16 1600 05/02/16 0414  AST 21  --   --   --   --   ALT 12*  --   --   --   --   ALKPHOS 43  --   --   --   --   BILITOT 0.7  --   --   --   --  ALBUMIN 2.2*  < > 2.4* 2.2* 2.2*  < > = values in this interval not displayed.  Cardiac Enzymes  Recent Labs Lab 04/30/16 1111 04/30/16 1615 04/30/16 2122  TROPONINI 0.12* 0.41* 0.40*    Glucose  Recent Labs Lab 05/01/16 1137 05/01/16 1607 05/01/16 1944 05/01/16 2337 05/02/16 0351 05/02/16 0751  GLUCAP 132* 119* 130* 118* 138* 121*    Imaging Dg Chest Port 1 View  Result Date: 05/02/2016 CLINICAL DATA:  Respiratory failure EXAM: PORTABLE CHEST 1 VIEW COMPARISON:  05/01/2016 FINDINGS: Cardiac shadow is stable. Endotracheal tube, nasogastric catheter and temporary dialysis catheter are again seen and stable. The lungs are well aerated bilaterally. Persistent left basilar infiltrate is seen although mildly improved when compared with the prior exam. Improved aeration is also noted in the right lung base. No sizable effusion or pneumothorax is noted. IMPRESSION: Persistent bibasilar infiltrates with some improved aeration. Electronically Signed   By: Alcide CleverMark  Lukens M.D.   On: 05/02/2016 07:35    STUDIES:  Labs  studies from [facility] 7.046/13/94/94% Bicarb 3 Anion gap 31 Lactic acid 4.1 WBC 28 BUN/Cr 190/13.4 K 7.8 Tylenol, alcohol, salicylate- Neg, UDS- Neg  CT head 04/29/16- No acute findings CT chest abdomen pelvis 04/29/16-Airspace consolidation in the left lower lobe consistent with acute pneumonia. Similar findings lesser degree on the right. Severe bilateral pan-lobar emphysema. No acute findings in the abdomen, pelvis.  CULTURES: Bcx X 2 9/6>  Ucx 9/6 >neg Sputum Cx 9/6 >  ANTIBIOTICS: Vanco 9/6>>>9/8 Zosyn 9/6>>>9/8 Azithro 9/6>>>9/8 Levofloxacin 9/8>>>stop 13th  SIGNIFICANT EVENTS: 9/6- Admit with sepsis, DKA, AKI 9/6 intubated, cvvhd 9/7- levo remains 9/8 - off levo, cvvhd , improved pcxr  LINES/TUBES:  DISCUSSION: 75 year old with diabetes, hypertension admitted with severe sepsis, multilobar pneumonia, possible aspiration in the setting of acute kidney injury, lactic acidosis, hyperkalemia. Intubated 9/6.  ASSESSMENT / PLAN:  PULMONARY A: Pneumonia, aspiration P:   ABg reviewed, rate down SBT , cpap 5 ps 5, goal  pcxr improved aeration, re assess in am   CARDIOVASCULAR A:  Sepsis, hypotension Got total 4 lt NS so far. P:  Even goals kvo   RENAL Lab Results  Component Value Date   CREATININE 1.42 (H) 05/02/2016   CREATININE 1.36 (H) 05/02/2016   CREATININE 1.64 (H) 05/01/2016    Recent Labs Lab 05/01/16 0430 05/01/16 1600 05/02/16 0414  K 4.2 4.5 4.6  4.6     A:   AKI P:   Now on CVVH per renal kvo Chem in am   GASTROINTESTINAL A:   Stable P:   Tube feeds start mandatory ppi  HEMATOLOGIC A:   Leukocytosis, sepsis Drop in plat ( sepsis, diltution) P:  Wbc noted, repeat in am Sub q hep maintain Dc pepcid   INFECTIOUS A:   Multilobar pneumonia P:   Pct drop, pressors improved Narrow to monotherpay levofloxacin, 8 days  ENDOCRINE CBG (last 3)   Recent Labs  05/01/16 2337 05/02/16 0351 05/02/16 0751  GLUCAP  118* 138* 121*     A:   DM DKA P:   SSI  NEUROLOGIC A:   Altered Encephalopathy likely from sepsis, metabolic derangements P:   Monitor neuro status Fent, wua  FAMILY  - Updates: No family at bedside. - Inter-disciplinary family meet or Palliative Care meeting due by:  9/6.  Ccm time 35 min   Mcarthur Rossettianiel J. Tyson AliasFeinstein, MD, FACP Pgr: [phone] [facility] 05/02/2016 10:56 AM

## 2016-05-02 NOTE — Progress Notes (Signed)
Pharmacy Antibiotic Note  Ross Daniel is a 75 y.o. male admitted on 04/30/2016 with pneumonia.  Pharmacy has been consulted for levaquin dosing. Today is day 4 Abx. Procal 28.75 at admission, now 21.95. CXR is improving. Currently on CVVHD. Vanc, zosyn, and azithromycin to be de-escalacted to levaquin.   Plan: Levaquin 500 mg x1 then 250mg  daily for a total of 5 more days of therapy Monitor WBC, tmax, c/s  Height: 5\' 7"  (170.2 cm) Weight: 145 lb 11.6 oz (66.1 kg) IBW/kg (Calculated) : 66.1  Temp (24hrs), Avg:97.5 F (36.4 C), Min:97 F (36.1 C), Max:98.3 F (36.8 C)   Recent Labs Lab 04/30/16 0432 04/30/16 0535 04/30/16 0548  04/30/16 0646  04/30/16 2122 05/01/16 0052 05/01/16 0430 05/01/16 1216 05/01/16 1600 05/01/16 2043 05/02/16 0414  WBC  --  6.1  --   --  5.9  --   --   --  24.5*  --   --   --  22.4*  CREATININE  --  10.39*  --   < > 10.11*  < > 3.32* 2.66* 2.30*  --  1.64*  --  1.36*  1.42*  LATICACIDVEN 3.5*  --  3.0*  --   --   --   --   --   --  2.3*  --  2.1*  --   < > = values in this interval not displayed.  Estimated Creatinine Clearance: 44 mL/min (by C-G formula based on SCr of 1.42 mg/dL).    No Known Allergies  Antimicrobials this admission: Zosyn 9/6 >>9/8 Vanc 9/6 >> 9/8  Dose adjustments this admission: Vancomycin 750 mg IV q24h              Microbiology results: 9/6 BCx: NGx2days 9/6 UCx: NG  9/6 MRSA PCR: Negative  Imaging: 9/7 CXR: Persistent unchanged bibasilar infiltrates, particularly from the left lung base. Bilateral pneumonia could present this fashion. Persistent left pleural effusion. 9/8 CXR: Persistent bibasilar infiltrates with some improved aeration.  Thank you for allowing pharmacy to be a part of this patient's care.  Alfredo BachJoseph Arminger, Cleotis NipperBS, PharmD Clinical Pharmacy Resident [phone] (Pager) 05/02/2016 1:42 PM

## 2016-05-02 NOTE — Progress Notes (Signed)
Patient ID: Ross Daniel, male   DOB: Jan 12, 1941, 75 y.o.   MRN: [id number] S:No changes overnight O:BP (!) 151/62   Pulse 90   Temp 97.4 F (36.3 C) (Oral)   Resp (!) 24   Ht 5\' 7"  (1.702 m)   Wt 66.1 kg (145 lb 11.6 oz)   SpO2 99%   BMI 22.82 kg/m   Intake/Output Summary (Last 24 hours) at 05/02/16 0852 Last data filed at 05/02/16 0800  Gross per 24 hour  Intake          2292.41 ml  Output             2000 ml  Net           292.41 ml   Intake/Output: I/O last 3 completed shifts: In: 5611.6 [I.V.:4211.6; IV Piggyback:1400] Out: 4628 [Urine:60; Other:4568]  Intake/Output this shift:  Total I/O In: -  Out: 94 [Other:94] Weight change: 1.5 kg (3 lb 4.9 oz) WUJ:WJXBJYNWGNF ill-appearing WM intubated and sedated CVS:no rub Resp:occ rhonchi AOZ:HYQMVH Ext:no edema   Recent Labs Lab 04/30/16 0646 04/30/16 1615 04/30/16 2122 05/01/16 0052 05/01/16 0430 05/01/16 1216 05/01/16 1600 05/01/16 1618 05/02/16 0414  NA 144 142 139 140 138  --  138  --  135  136  K 4.0 3.5 3.7 3.9 4.2  --  4.5  --  4.6  4.6  CL 117* 109 104 105 103  --  105  --  104  104  CO2 13* 22 25 26 24   --  28  --  24  27  GLUCOSE 283* 116* 210* 121* 159*  --  118*  --  152*  153*  BUN 160* 75* 52* 37* 31*  --  19  --  14  14  CREATININE 10.11* 4.47* 3.32* 2.66* 2.30*  --  1.64*  --  1.36*  1.42*  ALBUMIN 2.2* 2.2*  --   --  2.4*  --  2.2*  --  2.2*  CALCIUM 7.5* 7.2* 7.3* 7.4* 7.4*  --  7.6*  --  7.7*  7.7*  PHOS 7.3* 3.9  --   --  2.8 2.9 2.9 3.1 2.6  2.6  AST 21  --   --   --   --   --   --   --   --   ALT 12*  --   --   --   --   --   --   --   --    Liver Function Tests:  Recent Labs Lab 04/30/16 0646  05/01/16 0430 05/01/16 1600 05/02/16 0414  AST 21  --   --   --   --   ALT 12*  --   --   --   --   ALKPHOS 43  --   --   --   --   BILITOT 0.7  --   --   --   --   PROT 4.0*  --   --   --   --   ALBUMIN 2.2*  < > 2.4* 2.2* 2.2*  < > = values in this interval not  displayed. No results for input(s): LIPASE, AMYLASE in the last 168 hours. No results for input(s): AMMONIA in the last 168 hours. CBC:  Recent Labs Lab 04/30/16 0535  04/30/16 0646 05/01/16 0430 05/02/16 0414  WBC 6.1  --  5.9 24.5* 22.4*  NEUTROABS 5.7  --   --   --   --  HGB 9.6*  < > 9.9* 10.2* 8.4*  HCT 28.4*  < > 29.7* 29.6* 24.6*  MCV 87.4  --  87.9 84.6 85.7  PLT 205  --  202 154 101*  < > = values in this interval not displayed. Cardiac Enzymes:  Recent Labs Lab 04/30/16 0535 04/30/16 0646 04/30/16 1111 04/30/16 1615 04/30/16 2122  CKTOTAL  --  199  --   --   --   TROPONINI 0.04* 0.04* 0.12* 0.41* 0.40*   CBG:  Recent Labs Lab 05/01/16 1607 05/01/16 1944 05/01/16 2337 05/02/16 0351 05/02/16 0751  GLUCAP 119* 130* 118* 138* 121*    Iron Studies:  Recent Labs  04/30/16 0646  IRON 91  TIBC 179*   Studies/Results: US Renal  Result Date: 04/30/2016 CLINICAL DATA:  Acute kidney injury. EXAM: RENAL / URINARY TRACT ULTRASOUND COMPLETE COMPARISON:  None. FINDINGS: Right Kidney: Length: 9.9 cm. Echogenicity within normal limits. No mass or hydronephrosis visualized. Left Kidney: Length: 10.4 cm. 11 mm cyst in the midpole. Echogenicity within normal limits. No mass or hydronephrosis visualized. Bladder: Decompressed with Foley catheter in place. IMPRESSION: No acute findings. Electronically Signed   By: Charlett Nose M.D.   On: 04/30/2016 11:55   Dg Chest Port 1 View  Result Date: 05/02/2016 CLINICAL DATA:  Respiratory failure EXAM: PORTABLE CHEST 1 VIEW COMPARISON:  05/01/2016 FINDINGS: Cardiac shadow is stable. Endotracheal tube, nasogastric catheter and temporary dialysis catheter are again seen and stable. The lungs are well aerated bilaterally. Persistent left basilar infiltrate is seen although mildly improved when compared with the prior exam. Improved aeration is also noted in the right lung base. No sizable effusion or pneumothorax is noted. IMPRESSION:  Persistent bibasilar infiltrates with some improved aeration. Electronically Signed   By: Alcide Clever M.D.   On: 05/02/2016 07:35   Dg Chest Port 1 View  Result Date: 05/01/2016 CLINICAL DATA:  Respiratory failure. EXAM: PORTABLE CHEST 1 VIEW COMPARISON:  04/30/2016. FINDINGS: Endotracheal tube, left IJ line in stable position. NG tube has been advanced, its tip in side hole oral well below the left hemidiaphragm. Heart size normal. Bibasilar pulmonary infiltrates, left side greater than right again noted,. No interim change. Bilateral pneumonia could present in this fashion. Small left pleural effusion again noted. No pneumothorax. IMPRESSION: 1. Interim advancement of NG tube, its tip and side hole are well below the left hemidiaphragm. Endotracheal tube and left IJ line in stable position . 2. Persistent unchanged bibasilar infiltrates, particularly from the left lung base. Bilateral pneumonia could present this fashion. Persistent left pleural effusion. Electronically Signed   By: Maisie Fus  Register   On: 05/01/2016 07:08   . azithromycin  500 mg Intravenous Daily  . chlorhexidine  15 mL Mouth Rinse BID  . famotidine (PEPCID) IV  20 mg Intravenous Q24H  . feeding supplement (VITAL HIGH PROTEIN)  1,000 mL Per Tube Q24H  . heparin  5,000 Units Subcutaneous Q8H  . insulin aspart  1-3 Units Subcutaneous Q4H  . insulin glargine  25 Units Subcutaneous Q24H  . mouth rinse  15 mL Mouth Rinse QID  . piperacillin-tazobactam  3.375 g Intravenous Q6H  . vancomycin  750 mg Intravenous Q24H    BMET    Component Value Date/Time   NA 136 05/02/2016 0414   NA 135 05/02/2016 0414   K 4.6 05/02/2016 0414   K 4.6 05/02/2016 0414   CL 104 05/02/2016 0414   CL 104 05/02/2016 0414   CO2 27 05/02/2016 0414  CO2 24 05/02/2016 0414   GLUCOSE 153 (H) 05/02/2016 0414   GLUCOSE 152 (H) 05/02/2016 0414   BUN 14 05/02/2016 0414   BUN 14 05/02/2016 0414   CREATININE 1.42 (H) 05/02/2016 0414   CREATININE 1.36  (H) 05/02/2016 0414   CALCIUM 7.7 (L) 05/02/2016 0414   CALCIUM 7.7 (L) 05/02/2016 0414   GFRNONAA 48 (L) 05/02/2016 0414   GFRNONAA 50 (L) 05/02/2016 0414   GFRAA 55 (L) 05/02/2016 0414   GFRAA 58 (L) 05/02/2016 0414   CBC    Component Value Date/Time   WBC 22.4 (H) 05/02/2016 0414   RBC 2.87 (L) 05/02/2016 0414   HGB 8.4 (L) 05/02/2016 0414   HCT 24.6 (L) 05/02/2016 0414   PLT 101 (L) 05/02/2016 0414   MCV 85.7 05/02/2016 0414   MCH 29.3 05/02/2016 0414   MCHC 34.1 05/02/2016 0414   RDW 14.2 05/02/2016 0414   LYMPHSABS 0.3 (L) 04/30/2016 0535   MONOABS 0.2 04/30/2016 0535   EOSABS 0.0 04/30/2016 0535   BASOSABS 0.0 04/30/2016 0535     Pt was found down in his underwear by neighbors and [facility], sepsis, and AKI with severe metabolic and lactic acidosis.  +history of NSAIDS/lisinopril/metformen prior to admission  Assessment/Plan:  1. AKI non-oliguric- presumably due to ischemic ATN in setting of low BP and concomitant ACE inhibition, NSAIDS, and metformin.  CVVHD initiated 04/30/16 1. Continue CVVHD with 4K/2.5Ca dialysate and replacement fluids 2. Heparin on hold due to thrombocytopenia 3. Keeping even for now 2. VDRF- per PCCM 3. AMS- per primary 4. Pneumonia- abx per PCCM on sepsis protocol 5. Anemia- continue to follow. 6. Metabolic acidosis due to #1 and metformin.  Improved with CVVHD 7. F/E/N- initially hyperkalemic then hypo.  Stable on 4K bath. Julien NordmannJoseph A Yoselyn Mcglade

## 2016-05-03 ENCOUNTER — Inpatient Hospital Stay (HOSPITAL_COMMUNITY): Payer: Non-veteran care

## 2016-05-03 DIAGNOSIS — J9601 Acute respiratory failure with hypoxia: Secondary | ICD-10-CM

## 2016-05-03 DIAGNOSIS — J9600 Acute respiratory failure, unspecified whether with hypoxia or hypercapnia: Secondary | ICD-10-CM

## 2016-05-03 DIAGNOSIS — J189 Pneumonia, unspecified organism: Secondary | ICD-10-CM

## 2016-05-03 LAB — RENAL FUNCTION PANEL
ALBUMIN: 2 g/dL — AB (ref 3.5–5.0)
ALBUMIN: 2.1 g/dL — AB (ref 3.5–5.0)
Anion gap: 5 (ref 5–15)
Anion gap: 4 — ABNORMAL LOW (ref 5–15)
BUN: 18 mg/dL (ref 6–20)
BUN: 17 mg/dL (ref 6–20)
CALCIUM: 7.9 mg/dL — AB (ref 8.9–10.3)
CHLORIDE: 104 mmol/L (ref 101–111)
CO2: 29 mmol/L (ref 22–32)
CO2: 28 mmol/L (ref 22–32)
CREATININE: 0.95 mg/dL (ref 0.61–1.24)
CREATININE: 0.85 mg/dL (ref 0.61–1.24)
Calcium: 7.9 mg/dL — ABNORMAL LOW (ref 8.9–10.3)
Chloride: 105 mmol/L (ref 101–111)
GFR calc Af Amer: >60 mL/min (ref >60)
GFR calc non Af Amer: >60 mL/min (ref >60)
GLUCOSE: 136 mg/dL — AB (ref 65–99)
Glucose, Bld: 159 mg/dL — ABNORMAL HIGH (ref 65–99)
PHOSPHORUS: 1.2 mg/dL — AB (ref 2.5–4.6)
PHOSPHORUS: 2.7 mg/dL (ref 2.5–4.6)
Potassium: 4.2 mmol/L (ref 3.5–5.1)
Potassium: 4 mmol/L (ref 3.5–5.1)
SODIUM: 137 mmol/L (ref 135–145)
Sodium: 138 mmol/L (ref 135–145)

## 2016-05-03 LAB — GLUCOSE, CAPILLARY
GLUCOSE-CAPILLARY: 142 mg/dL — AB (ref 65–99)
GLUCOSE-CAPILLARY: 205 mg/dL — AB (ref 65–99)
Glucose-Capillary: 151 mg/dL — ABNORMAL HIGH (ref 65–99)
Glucose-Capillary: 132 mg/dL — ABNORMAL HIGH (ref 65–99)
Glucose-Capillary: 143 mg/dL — ABNORMAL HIGH (ref 65–99)
Glucose-Capillary: 180 mg/dL — ABNORMAL HIGH (ref 65–99)

## 2016-05-03 LAB — ECHOCARDIOGRAM COMPLETE
Height: 67 in
Weight: 2243.4 oz

## 2016-05-03 LAB — CBC
HEMATOCRIT: 22.9 % — AB (ref 39.0–52.0)
Hemoglobin: 7.7 g/dL — ABNORMAL LOW (ref 13.0–17.0)
MCH: 29.4 pg (ref 26.0–34.0)
MCHC: 33.6 g/dL (ref 30.0–36.0)
MCV: 87.4 fL (ref 78.0–100.0)
PLATELETS: 112 10*3/uL — AB (ref 150–400)
RBC: 2.62 MIL/uL — ABNORMAL LOW (ref 4.22–5.81)
RDW: 14.2 % (ref 11.5–15.5)
WBC: 22.4 10*3/uL — ABNORMAL HIGH (ref 4.0–10.5)

## 2016-05-03 LAB — PROCALCITONIN: PROCALCITONIN: 10.9 ng/mL

## 2016-05-03 LAB — MAGNESIUM: Magnesium: 2.4 mg/dL (ref 1.7–2.4)

## 2016-05-03 LAB — APTT: APTT: 41 s — AB (ref 24–36)

## 2016-05-03 MED ORDER — SODIUM PHOSPHATES 45 MMOLE/15ML IV SOLN
30.0000 mmol | Freq: Once | INTRAVENOUS | Status: AC
  Administered 2016-05-03: 30 mmol via INTRAVENOUS
  Filled 2016-05-03 (×2): qty 10

## 2016-05-03 MED ORDER — DARBEPOETIN ALFA 100 MCG/0.5ML IJ SOSY
100.0000 ug | PREFILLED_SYRINGE | INTRAMUSCULAR | Status: DC
  Administered 2016-05-03: 100 ug via INTRAVENOUS
  Filled 2016-05-03: qty 0.5

## 2016-05-03 MED ORDER — MIDAZOLAM HCL 2 MG/2ML IJ SOLN
2.0000 mg | INTRAMUSCULAR | Status: DC | PRN
  Administered 2016-05-03: 1 mg via INTRAVENOUS
  Administered 2016-05-04 – 2016-05-05 (×3): 2 mg via INTRAVENOUS
  Filled 2016-05-03 (×3): qty 2

## 2016-05-03 NOTE — Progress Notes (Signed)
Patient ID: Kross Swallows, male   DOB: 1940-12-17, 75 y.o.   MRN: [id number] S:No significant events overnight O:BP (!) 178/73   Pulse 93   Temp 97.5 F (36.4 C) (Axillary)   Resp 19   Ht 5\' 7"  (1.702 m)   Wt 63.6 kg (140 lb 3.4 oz)   SpO2 100%   BMI 21.96 kg/m   Intake/Output Summary (Last 24 hours) at 05/03/16 0747 Last data filed at 05/03/16 0700  Gross per 24 hour  Intake          2845.47 ml  Output             3389 ml  Net          -543.53 ml   Intake/Output: I/O last 3 completed shifts: In: 3896.6 [I.V.:1561.6; Other:10; NG/GT:1625; IV Piggyback:700] Out: 4057 [Urine:30; Other:4027]  Intake/Output this shift:  No intake/output data recorded. Weight change: -2.5 kg (-5 lb 8.2 oz) NWG:NFAOZHYQMVH ill-appearing WM intubated - alert but no commands Resp:occ rhonchi QIO:NGEXBM Ext: pitting dependent edema HD cath placed 9/6   Recent Labs Lab 04/30/16 0646 04/30/16 1615 04/30/16 2122 05/01/16 0052 05/01/16 0430 05/01/16 1216 05/01/16 1600 05/01/16 1618 05/02/16 0414 05/02/16 1515 05/03/16 0340  NA 144 142 139 140 138  --  138  --  135  136 135 138  K 4.0 3.5 3.7 3.9 4.2  --  4.5  --  4.6  4.6 4.3 4.2  CL 117* 109 104 105 103  --  105  --  104  104 103 104  CO2 13* 22 25 26 24   --  28  --  24  27 27 29   GLUCOSE 283* 116* 210* 121* 159*  --  118*  --  152*  153* 193* 159*  BUN 160* 75* 52* 37* 31*  --  19  --  14  14 13 18   CREATININE 10.11* 4.47* 3.32* 2.66* 2.30*  --  1.64*  --  1.36*  1.42* 1.12 0.95  ALBUMIN 2.2* 2.2*  --   --  2.4*  --  2.2*  --  2.2* 2.1* 2.0*  CALCIUM 7.5* 7.2* 7.3* 7.4* 7.4*  --  7.6*  --  7.7*  7.7* 7.6* 7.9*  PHOS 7.3* 3.9  --   --  2.8 2.9 2.9 3.1 2.6  2.6 1.9*  1.9* 1.2*  AST 21  --   --   --   --   --   --   --   --   --   --   ALT 12*  --   --   --   --   --   --   --   --   --   --    Liver Function Tests:  Recent Labs Lab 04/30/16 0646  05/02/16 0414 05/02/16 1515 05/03/16 0340  AST 21  --   --   --   --    ALT 12*  --   --   --   --   ALKPHOS 43  --   --   --   --   BILITOT 0.7  --   --   --   --   PROT 4.0*  --   --   --   --   ALBUMIN 2.2*  < > 2.2* 2.1* 2.0*  < > = values in this interval not displayed. No results for input(s): LIPASE, AMYLASE in the last 168 hours. No results for input(s): AMMONIA in the  last 168 hours. CBC:  Recent Labs Lab 04/30/16 0535  04/30/16 0646 05/01/16 0430 05/02/16 0414  WBC 6.1  --  5.9 24.5* 22.4*  NEUTROABS 5.7  --   --   --   --   HGB 9.6*  < > 9.9* 10.2* 8.4*  HCT 28.4*  < > 29.7* 29.6* 24.6*  MCV 87.4  --  87.9 84.6 85.7  PLT 205  --  202 154 101*  < > = values in this interval not displayed. Cardiac Enzymes:  Recent Labs Lab 04/30/16 0535 04/30/16 0646 04/30/16 1111 04/30/16 1615 04/30/16 2122  CKTOTAL  --  199  --   --   --   TROPONINI 0.04* 0.04* 0.12* 0.41* 0.40*   CBG:  Recent Labs Lab 05/02/16 1130 05/02/16 1522 05/02/16 1938 05/02/16 2344 05/03/16 0322  GLUCAP 111* 186* 161* 157* 151*    Iron Studies: No results for input(s): IRON, TIBC, TRANSFERRIN, FERRITIN in the last 72 hours. Studies/Results: Dg Chest Port 1 View  Result Date: 05/02/2016 CLINICAL DATA:  Respiratory failure EXAM: PORTABLE CHEST 1 VIEW COMPARISON:  05/01/2016 FINDINGS: Cardiac shadow is stable. Endotracheal tube, nasogastric catheter and temporary dialysis catheter are again seen and stable. The lungs are well aerated bilaterally. Persistent left basilar infiltrate is seen although mildly improved when compared with the prior exam. Improved aeration is also noted in the right lung base. No sizable effusion or pneumothorax is noted. IMPRESSION: Persistent bibasilar infiltrates with some improved aeration. Electronically Signed   By: Alcide Clever M.D.   On: 05/02/2016 07:35   . chlorhexidine  15 mL Mouth Rinse BID  . docusate  100 mg Per Tube BID  . feeding supplement (VITAL HIGH PROTEIN)  1,000 mL Per Tube Q24H  . heparin  5,000 Units Subcutaneous  Q8H  . insulin aspart  1-3 Units Subcutaneous Q4H  . insulin glargine  25 Units Subcutaneous Q24H  . levofloxacin (LEVAQUIN) IV  250 mg Intravenous Q24H  . mouth rinse  15 mL Mouth Rinse QID  . pantoprazole sodium  40 mg Per Tube Daily  . sodium phosphate  Dextrose 5% IVPB  30 mmol Intravenous Once    BMET    Component Value Date/Time   NA 138 05/03/2016 0340   K 4.2 05/03/2016 0340   CL 104 05/03/2016 0340   CO2 29 05/03/2016 0340   GLUCOSE 159 (H) 05/03/2016 0340   BUN 18 05/03/2016 0340   CREATININE 0.95 05/03/2016 0340   CALCIUM 7.9 (L) 05/03/2016 0340   GFRNONAA >60 05/03/2016 0340   GFRAA >60 05/03/2016 0340   CBC    Component Value Date/Time   WBC 22.4 (H) 05/02/2016 0414   RBC 2.87 (L) 05/02/2016 0414   HGB 8.4 (L) 05/02/2016 0414   HCT 24.6 (L) 05/02/2016 0414   PLT 101 (L) 05/02/2016 0414   MCV 85.7 05/02/2016 0414   MCH 29.3 05/02/2016 0414   MCHC 34.1 05/02/2016 0414   RDW 14.2 05/02/2016 0414   LYMPHSABS 0.3 (L) 04/30/2016 0535   MONOABS 0.2 04/30/2016 0535   EOSABS 0.0 04/30/2016 0535   BASOSABS 0.0 04/30/2016 0535     Pt was found down in his underwear by neighbors and transferred from [facility] ED to [facility] for pneumonia, sepsis, and AKI with severe metabolic and lactic acidosis.  +history of NSAIDS/lisinopril/metformen prior to admission  Assessment/Plan:  1. AKI - now anuric- presumably due to ischemic ATN in setting of low BP and concomitant ACE inhibition, NSAIDS, and metformin.  CVVHD  initiated 04/30/16.  Do not know renal function baseline  1. Continue CVVHD with 4K/2.5Ca dialysate and replacement fluids 2. Heparin on hold due to thrombocytopenia 3. Given pitting edema and BP now high- will begin some UF- could be transitioned off CRRT but with no UOP and volume overload I want to try and achieve Daniel neg balance next 24 hours, then likely to transition to IHD- vascath in since 9/6 2. VDRF- per PCCM- will try some UF to assist with wean 3. AMS- per  primary 4. Pneumonia- abx per PCCM on sepsis protocol 5. Anemia- continue to follow. Had acute drop- will recheck today and daily - and add aranesp 6. Metabolic acidosis due to #1 and metformin.  Improved with CVVHD 7. F/E/N- initially hyperkalemic then hypo.  Stable on 4K bath. 8. Hypophos- agree with repletion    Ross Daniel

## 2016-05-03 NOTE — Progress Notes (Signed)
  Echocardiogram 2D Echocardiogram has been performed.  Ross Daniel, Ross Daniel R 05/03/2016, 1:28 PM

## 2016-05-03 NOTE — Progress Notes (Signed)
PULMONARY / CRITICAL CARE MEDICINE   Name: Ross Daniel MRN: [id number] DOB: 08-05-1941    ADMISSION DATE:  04/30/2016 CONSULTATION DATE:  04/30/16  REFERRING MD:  [facility]  CHIEF COMPLAINT:  Pneumonia, sepsis, AKI  HISTORY OF PRESENT ILLNESS:   Ross Daniel is a 75 year old with possible history of diabetes, hypertension. He was found unresponsive in his underwear outside his house by neightbours. He was brought to [facility] ED where he was found to have pneumonia, sepsis with severe anion Metabolic acidosis, lactic acidosis, AKI (labs below). He has been transferred to [facility] ED for further evaluation.    SUBJECTIVE:  ccvhd continued, levo off. (-) issues overnight.  Slow to wake up  VITAL SIGNS: BP (!) 155/65   Pulse 93   Temp 98.2 F (36.8 C) (Oral)   Resp 18   Ht 5\' 7"  (1.702 m)   Wt 63.6 kg (140 lb 3.4 oz)   SpO2 100%   BMI 21.96 kg/m   HEMODYNAMICS:    VENTILATOR SETTINGS: Vent Mode: PSV;CPAP FiO2 (%):  [40 %] 40 % Set Rate:  [18 bmp] 18 bmp Vt Set:  [530 mL] 530 mL PEEP:  [5 cmH20] 5 cmH20 Pressure Support:  [5 cmH20] 5 cmH20 Plateau Pressure:  [15 cmH20-17 cmH20] 15 cmH20  INTAKE / OUTPUT: I/O last 3 completed shifts: In: 3939.6 [I.V.:1561.6; Other:10; NG/GT:1625; IV Piggyback:743] Out: 4057 [Urine:30; Other:4027]  PHYSICAL EXAMINATION: General:  Opens eyed and does follows simple commands.  Neuro:  Cranial nerves grossly intact. No lateralizing sign. HEENT:  PERRL EOMI Cardiovascular:  s1 s2 rrr, No MRG Lungs:  Clear Abdomen:  Soft, + BS, no r/g Musculoskeletal:  Normal tone and bulk Skin:  Intact  LABS:  BMET  Recent Labs Lab 05/02/16 0414 05/02/16 1515 05/03/16 0340  NA 135  136 135 138  K 4.6  4.6 4.3 4.2  CL 104  104 103 104  CO2 24  27 27 29   BUN 14  14 13 18   CREATININE 1.36*  1.42* 1.12 0.95  GLUCOSE 152*  153* 193* 159*    Electrolytes  Recent Labs Lab 05/02/16 0414 05/02/16 1515 05/03/16 0340  CALCIUM 7.7*   7.7* 7.6* 7.9*  MG 2.2 2.3 2.4  PHOS 2.6  2.6 1.9*  1.9* 1.2*    CBC  Recent Labs Lab 05/01/16 0430 05/02/16 0414 05/03/16 0830  WBC 24.5* 22.4* 22.4*  HGB 10.2* 8.4* 7.7*  HCT 29.6* 24.6* 22.9*  PLT 154 101* 112*    Coag's  Recent Labs Lab 05/01/16 0430 05/02/16 0414 05/03/16 0340  APTT >200* 46* 41*    Sepsis Markers  Recent Labs Lab 04/30/16 0548 05/01/16 1116 05/01/16 1216 05/01/16 2043 05/02/16 0414 05/03/16 0340  LATICACIDVEN 3.0*  --  2.3* 2.1*  --   --   PROCALCITON  --  33.04  --   --  21.95 10.90    ABG  Recent Labs Lab 04/30/16 0938 05/01/16 0435 05/02/16 0313  PHART 7.393 7.428 7.405  PCO2ART 27.2* 34.1 42.1  PO2ART 166* 80.1* 57.1*    Liver Enzymes  Recent Labs Lab 04/30/16 0646  05/02/16 0414 05/02/16 1515 05/03/16 0340  AST 21  --   --   --   --   ALT 12*  --   --   --   --   ALKPHOS 43  --   --   --   --   BILITOT 0.7  --   --   --   --  ALBUMIN 2.2*  < > 2.2* 2.1* 2.0*  < > = values in this interval not displayed.  Cardiac Enzymes  Recent Labs Lab 04/30/16 1111 04/30/16 1615 04/30/16 2122  TROPONINI 0.12* 0.41* 0.40*    Glucose  Recent Labs Lab 05/02/16 1130 05/02/16 1522 05/02/16 1938 05/02/16 2344 05/03/16 0322 05/03/16 0800  GLUCAP 111* 186* 161* 157* 151* 132*    Imaging Dg Chest Port 1 View  Result Date: 05/03/2016 CLINICAL DATA:  Shortness of Breath EXAM: PORTABLE CHEST 1 VIEW COMPARISON:  05/02/2016 FINDINGS: Cardiomediastinal silhouette is stable. Stable right adjacent line position there is endotracheal tube in place with tip 4.2 cm above the carina. NG tube in place with tip in distal stomach. Persistent hazy bilateral basilar infiltrates left greater than right. No pneumothorax. IMPRESSION: Support apparatus in place. No pneumothorax. Persistent hazy bilateral basilar infiltrates left greater than right. Electronically Signed   By: Natasha MeadLiviu  Pop M.D.   On: 05/03/2016 11:09   Dg Chest Port 1  View  Result Date: 05/03/2016 CLINICAL DATA:  Pneumonia. EXAM: PORTABLE CHEST 1 VIEW COMPARISON:  Radiographs of May 02, 2016. FINDINGS: The heart size and mediastinal contours are within normal limits. Atherosclerosis of thoracic aorta is noted. Endotracheal and nasogastric tubes are unchanged in position. Left internal jugular catheter is also unchanged with distal tip in expected position of the SVC. Right lung is clear. Stable left basilar opacity is noted consistent with pneumonia. No pneumothorax is noted. The visualized skeletal structures are unremarkable. IMPRESSION: Stable support apparatus. Stable left lower lobe pneumonia. Aortic atherosclerosis. Electronically Signed   By: Lupita RaiderJames  Green Jr, M.D.   On: 05/03/2016 09:10    STUDIES:  [facility] 7.046/13/94/94% Bicarb 3 Anion gap 31 Lactic acid 4.1 WBC 28 BUN/Cr 190/13.4 K 7.8 Tylenol, alcohol, salicylate- Neg, UDS- Neg  CT head 04/29/16- No acute findings CT chest abdomen pelvis 04/29/16-Airspace consolidation in the left lower lobe consistent with acute pneumonia. Similar findings lesser degree on the right. Severe bilateral pan-lobar emphysema. No acute findings in the abdomen, pelvis.  CULTURES: Bcx X 2 9/6>  Ucx 9/6 >neg Sputum Cx 9/6 >  ANTIBIOTICS: Vanco 9/6>>>9/8 Zosyn 9/6>>>9/8 Azithro 9/6>>>9/8 Levofloxacin 9/8>>>stop 13th  SIGNIFICANT EVENTS: 9/6- Admit with sepsis, DKA, AKI 9/6 intubated, cvvhd 9/7- levo remains 9/8 - off levo, cvvhd , improved pcxr  LINES/TUBES:  DISCUSSION: 75 year old with diabetes, hypertension admitted with severe sepsis, multilobar pneumonia, possible aspiration in the setting of acute kidney injury, lactic acidosis, hyperkalemia. Intubated 9/6.  ASSESSMENT / PLAN:  PULMONARY A: Pneumonia, aspiration. LLL P:   Cont vent support.  Daily PST Cont abx  CARDIOVASCULAR A:  Sepsis, hypotension Got total 4 lt NS so far. P:  Keep euvolemic.   RENAL Lab  Results  Component Value Date   CREATININE 0.95 05/03/2016   CREATININE 1.12 05/02/2016   CREATININE 1.42 (H) 05/02/2016   CREATININE 1.36 (H) 05/02/2016    Recent Labs Lab 05/02/16 0414 05/02/16 1515 05/03/16 0340  K 4.6  4.6 4.3 4.2     A:   AKI. Better Lactic acidosis Metabolic acidosis P:   cont CVVH per renal Keep euvolemic.   GASTROINTESTINAL A:   Stable P:   Cont TF ppi  HEMATOLOGIC A:   Leukocytosis, sepsis Drop in plat ( sepsis, diltution) P:  Observe CBC  INFECTIOUS A:   Multilobar pneumonia P:   Pct drop, pressors improved Narrow to monotherpay levofloxacin, 8 days  ENDOCRINE CBG (last 3)   Recent Labs  05/02/16 2344 05/03/16 0322 05/03/16 0800  GLUCAP 157* 151* 132*     A:   DM DKA P:   SSI  NEUROLOGIC A:   Encephalopathy likely from sepsis, metabolic derangements P:   Monitor neuro status Fent, wua  FAMILY  - Updates: No family at bedside. - Inter-disciplinary family meet or Palliative Care meeting due by:  9/6.  Ccm time 35 min   J. Alexis Frock, MD 05/03/2016, 12:38 PM [facility] Pager [phone] After 3 pm or if no answer, call [phone]

## 2016-05-04 LAB — MAGNESIUM: Magnesium: 2.4 mg/dL (ref 1.7–2.4)

## 2016-05-04 LAB — GLUCOSE, CAPILLARY
GLUCOSE-CAPILLARY: 138 mg/dL — AB (ref 65–99)
GLUCOSE-CAPILLARY: 252 mg/dL — AB (ref 65–99)
GLUCOSE-CAPILLARY: 306 mg/dL — AB (ref 65–99)
GLUCOSE-CAPILLARY: 310 mg/dL — AB (ref 65–99)
Glucose-Capillary: 167 mg/dL — ABNORMAL HIGH (ref 65–99)
Glucose-Capillary: 244 mg/dL — ABNORMAL HIGH (ref 65–99)
Glucose-Capillary: 274 mg/dL — ABNORMAL HIGH (ref 65–99)

## 2016-05-04 LAB — RENAL FUNCTION PANEL
Albumin: 2.2 g/dL — ABNORMAL LOW (ref 3.5–5.0)
Anion gap: 7 (ref 5–15)
BUN: 19 mg/dL (ref 6–20)
CHLORIDE: 103 mmol/L (ref 101–111)
CO2: 28 mmol/L (ref 22–32)
CREATININE: 0.95 mg/dL (ref 0.61–1.24)
Calcium: 8.2 mg/dL — ABNORMAL LOW (ref 8.9–10.3)
GFR calc Af Amer: >60 mL/min (ref >60)
GFR calc non Af Amer: >60 mL/min (ref >60)
GLUCOSE: 143 mg/dL — AB (ref 65–99)
POTASSIUM: 4.2 mmol/L (ref 3.5–5.1)
Phosphorus: 2.2 mg/dL — ABNORMAL LOW (ref 2.5–4.6)
Sodium: 138 mmol/L (ref 135–145)

## 2016-05-04 LAB — CBC
HEMATOCRIT: 24.9 % — AB (ref 39.0–52.0)
HEMOGLOBIN: 8.3 g/dL — AB (ref 13.0–17.0)
MCH: 29.5 pg (ref 26.0–34.0)
MCHC: 33.3 g/dL (ref 30.0–36.0)
MCV: 88.6 fL (ref 78.0–100.0)
Platelets: 129 10*3/uL — ABNORMAL LOW (ref 150–400)
RBC: 2.81 MIL/uL — AB (ref 4.22–5.81)
RDW: 14.5 % (ref 11.5–15.5)
WBC: 21.6 10*3/uL — ABNORMAL HIGH (ref 4.0–10.5)

## 2016-05-04 LAB — APTT: aPTT: 39 seconds — ABNORMAL HIGH (ref 24–36)

## 2016-05-04 MED ORDER — HEPARIN SODIUM (PORCINE) 1000 UNIT/ML DIALYSIS
1000.0000 [IU] | INTRAMUSCULAR | Status: DC | PRN
  Administered 2016-05-04: 2400 [IU] via INTRAVENOUS_CENTRAL
  Filled 2016-05-04 (×2): qty 6

## 2016-05-04 MED ORDER — INSULIN ASPART 100 UNIT/ML ~~LOC~~ SOLN
0.0000 [IU] | SUBCUTANEOUS | Status: DC
  Administered 2016-05-04: 7 [IU] via SUBCUTANEOUS
  Administered 2016-05-04: 5 [IU] via SUBCUTANEOUS
  Administered 2016-05-04 – 2016-05-05 (×2): 3 [IU] via SUBCUTANEOUS
  Administered 2016-05-05 (×2): 2 [IU] via SUBCUTANEOUS
  Administered 2016-05-05: 5 [IU] via SUBCUTANEOUS
  Administered 2016-05-05: 3 [IU] via SUBCUTANEOUS
  Administered 2016-05-05: 5 [IU] via SUBCUTANEOUS
  Administered 2016-05-06: 2 [IU] via SUBCUTANEOUS
  Administered 2016-05-06 (×2): 3 [IU] via SUBCUTANEOUS
  Administered 2016-05-06 (×2): 1 [IU] via SUBCUTANEOUS
  Administered 2016-05-07: 3 [IU] via SUBCUTANEOUS
  Administered 2016-05-07: 5 [IU] via SUBCUTANEOUS
  Administered 2016-05-07 (×2): 2 [IU] via SUBCUTANEOUS
  Administered 2016-05-07: 9 [IU] via SUBCUTANEOUS
  Administered 2016-05-07: 1 [IU] via SUBCUTANEOUS
  Administered 2016-05-08 (×2): 2 [IU] via SUBCUTANEOUS
  Administered 2016-05-08: 5 [IU] via SUBCUTANEOUS
  Administered 2016-05-08: 2 [IU] via SUBCUTANEOUS
  Administered 2016-05-08: 3 [IU] via SUBCUTANEOUS
  Administered 2016-05-09 (×2): 1 [IU] via SUBCUTANEOUS
  Administered 2016-05-09: 5 [IU] via SUBCUTANEOUS
  Administered 2016-05-09: 1 [IU] via SUBCUTANEOUS
  Administered 2016-05-10: 2 [IU] via SUBCUTANEOUS
  Administered 2016-05-10: 1 [IU] via SUBCUTANEOUS

## 2016-05-04 MED ORDER — FUROSEMIDE 10 MG/ML IJ SOLN
160.0000 mg | Freq: Two times a day (BID) | INTRAVENOUS | Status: AC
  Administered 2016-05-04 (×2): 160 mg via INTRAVENOUS
  Filled 2016-05-04 (×2): qty 16

## 2016-05-04 MED ORDER — LEVOFLOXACIN IN D5W 500 MG/100ML IV SOLN
500.0000 mg | INTRAVENOUS | Status: AC
  Administered 2016-05-05: 500 mg via INTRAVENOUS
  Filled 2016-05-04: qty 100

## 2016-05-04 MED ORDER — PNEUMOCOCCAL VAC POLYVALENT 25 MCG/0.5ML IJ INJ: 0.5000 mL | INJECTION | INTRAMUSCULAR | Status: DC | PRN

## 2016-05-04 MED ORDER — SODIUM PHOSPHATES 45 MMOLE/15ML IV SOLN
30.0000 mmol | Freq: Once | INTRAVENOUS | Status: AC
  Administered 2016-05-04: 30 mmol via INTRAVENOUS
  Filled 2016-05-04: qty 10

## 2016-05-04 NOTE — Progress Notes (Signed)
PULMONARY / CRITICAL CARE MEDICINE   Name: Ross Daniel MRN: [id number] DOB: 12-Mar-1941    ADMISSION DATE:  04/30/2016 CONSULTATION DATE:  04/30/16  REFERRING MD:  [facility]  CHIEF COMPLAINT:  Pneumonia, sepsis, AKI  HISTORY OF PRESENT ILLNESS:   [name] is a 75 year old with possible history of diabetes, hypertension. He was found unresponsive in his underwear outside his house by neightbours. He was brought to [facility] ED where he was found to have pneumonia, sepsis with severe anion Metabolic acidosis, lactic acidosis, AKI (labs below). He has been transferred to [facility] ED for further evaluation.    SUBJECTIVE:  ccvhd continued, levo off. (-) issues overnight.  Slow to wake up  VITAL SIGNS: BP 132/60   Pulse 99   Temp 99.9 F (37.7 C) (Oral)   Resp 19   Ht 5\' 7"  (1.702 m)   Wt 62.3 kg (137 lb 5.6 oz)   SpO2 100%   BMI 21.51 kg/m   HEMODYNAMICS:    VENTILATOR SETTINGS: Vent Mode: PRVC FiO2 (%):  [40 %] 40 % Set Rate:  [18 bmp] 18 bmp Vt Set:  [530 mL] 530 mL PEEP:  [5 cmH20] 5 cmH20 Pressure Support:  [5 cmH20] 5 cmH20 Plateau Pressure:  [13 cmH20-18 cmH20] 14 cmH20  INTAKE / OUTPUT: I/O last 3 completed shifts: In: 4517.4 [I.V.:1464.4; Other:50; NG/GT:2695; IV Piggyback:308] Out: 6681 [Other:6681]  PHYSICAL EXAMINATION: General:  Opens eyed and does follows simple commands.  Neuro:  Cranial nerves grossly intact. No lateralizing sign. HEENT:  PERRL EOMI Cardiovascular:  s1 s2 rrr, No MRG Lungs:  Clear Abdomen:  Soft, + BS, no r/g Musculoskeletal:  Normal tone and bulk Skin:  Intact  LABS:  BMET  Recent Labs Lab 05/03/16 0340 05/03/16 1510 05/04/16 0315  NA 138 137 138  K 4.2 4.0 4.2  CL 104 105 103  CO2 29 28 28   BUN 18 17 19   CREATININE 0.95 0.85 0.95  GLUCOSE 159* 136* 143*    Electrolytes  Recent Labs Lab 05/02/16 1515 05/03/16 0340 05/03/16 1510 05/04/16 0315  CALCIUM 7.6* 7.9* 7.9* 8.2*  MG 2.3 2.4  --  2.4  PHOS 1.9*   1.9* 1.2* 2.7 2.2*    CBC  Recent Labs Lab 05/02/16 0414 05/03/16 0830 05/04/16 0315  WBC 22.4* 22.4* 21.6*  HGB 8.4* 7.7* 8.3*  HCT 24.6* 22.9* 24.9*  PLT 101* 112* 129*    Coag's  Recent Labs Lab 05/02/16 0414 05/03/16 0340 05/04/16 0315  APTT 46* 41* 39*    Sepsis Markers  Recent Labs Lab 04/30/16 0548 05/01/16 1116 05/01/16 1216 05/01/16 2043 05/02/16 0414 05/03/16 0340  LATICACIDVEN 3.0*  --  2.3* 2.1*  --   --   PROCALCITON  --  33.04  --   --  21.95 10.90    ABG  Recent Labs Lab 04/30/16 0938 05/01/16 0435 05/02/16 0313  PHART 7.393 7.428 7.405  PCO2ART 27.2* 34.1 42.1  PO2ART 166* 80.1* 57.1*    Liver Enzymes  Recent Labs Lab 04/30/16 0646  05/03/16 0340 05/03/16 1510 05/04/16 0315  AST 21  --   --   --   --   ALT 12*  --   --   --   --   ALKPHOS 43  --   --   --   --   BILITOT 0.7  --   --   --   --   ALBUMIN 2.2*  < > 2.0* 2.1* 2.2*  < > = values  in this interval not displayed.  Cardiac Enzymes  Recent Labs Lab 04/30/16 1111 04/30/16 1615 04/30/16 2122  TROPONINI 0.12* 0.41* 0.40*    Glucose  Recent Labs Lab 05/03/16 2008 05/03/16 2352 05/04/16 0315 05/04/16 0759 05/04/16 1241 05/04/16 1300  GLUCAP 180* 205* 138* 167* 306* 310*    Imaging No results found.  STUDIES:  [facility] Ph 7.046/13/94/94% Bicarb 3 Anion gap 31 Lactic acid 4.1 WBC 28 BUN/Cr 190/13.4 K 7.8 Tylenol, alcohol, salicylate- Neg, UDS- Neg  CT head 04/29/16- No acute findings CT chest abdomen pelvis 04/29/16-Airspace consolidation in the left lower lobe consistent with acute pneumonia. Similar findings lesser degree on the right. Severe bilateral pan-lobar emphysema. No acute findings in the abdomen, pelvis.  CULTURES: Bcx X 2 9/6>  Ucx 9/6 >neg Sputum Cx 9/6 >  ANTIBIOTICS: Vanco 9/6>>>9/8 Zosyn 9/6>>>9/8 Azithro 9/6>>>9/8 Levofloxacin 9/8>>>stop 13th  SIGNIFICANT EVENTS: 9/6- Admit with sepsis, DKA, AKI 9/6  intubated, cvvhd 9/7- levo remains 9/8 - off levo, cvvhd , improved pcxr  LINES/TUBES:  DISCUSSION: 75 year old with diabetes, hypertension admitted with severe sepsis, multilobar pneumonia, possible aspiration in the setting of acute kidney injury, lactic acidosis, hyperkalemia. Intubated 9/6.  ASSESSMENT / PLAN:  PULMONARY A: Pneumonia, aspiration. LLL P:   Cont vent support.  Daily PST Cont abx  CARDIOVASCULAR A:  Sepsis, hypotension  P:  Keep euvolemic.   RENAL Lab Results  Component Value Date   CREATININE 0.95 05/04/2016   CREATININE 0.85 05/03/2016   CREATININE 0.95 05/03/2016    Recent Labs Lab 05/03/16 0340 05/03/16 1510 05/04/16 0315  K 4.2 4.0 4.2     A:   AKI. Better Lactic acidosis Metabolic acidosis P:   cont CVVH per renal Keep euvolemic.   GASTROINTESTINAL A:   Stable P:   Cont TF ppi  HEMATOLOGIC A:   Leukocytosis, sepsis Drop in plat ( sepsis, diltution) P:  Observe CBC  INFECTIOUS A:   Multilobar pneumonia P:   Pct drop, pressors improved Narrow to monotherpay levofloxacin, 8 days  ENDOCRINE CBG (last 3)   Recent Labs  05/04/16 0759 05/04/16 1241 05/04/16 1300  GLUCAP 167* 306* 310*     A:   DM DKA P:   SSI  NEUROLOGIC A:   Encephalopathy likely from sepsis, metabolic derangements P:   Monitor neuro status Fent, wua  FAMILY  - Updates: No family at bedside. - Inter-disciplinary family meet or Palliative Care meeting due by:  9/6.  Ccm time 35 min   J. Alexis FrockAngelo A de Dios, MD 05/04/2016, 1:11 PM [facility] Pager [phone] After 3 pm or if no answer, call [phone]

## 2016-05-04 NOTE — Progress Notes (Signed)
Patient ID: Ross Daniel, male   DOB: 08/04/1941, 75 y.o.   MRN: [id number] S:No significant events overnight- running neg with CRRT- removed 2 liters- no UOP  O:BP 129/69   Pulse 94   Temp 97.4 F (36.3 C) (Oral)   Resp 15   Ht 5\' 7"  (1.702 m)   Wt 62.3 kg (137 lb 5.6 oz)   SpO2 99%   BMI 21.51 kg/m   Intake/Output Summary (Last 24 hours) at 05/04/16 0801 Last data filed at 05/04/16 0700  Gross per 24 hour  Intake          2929.75 ml  Output             5153 ml  Net         -2223.25 ml   Intake/Output: I/O last 3 completed shifts: In: 4517.4 [I.V.:1464.4; Other:50; NG/GT:2695; IV Piggyback:308] Out: 6681 [Other:6681]  Intake/Output this shift:  No intake/output data recorded. Weight change: -1.3 kg (-2 lb 13.9 oz) NWG:NFAOZHYQMVH ill-appearing WM intubated - alert but no commands Resp:occ rhonchi QIO:NGEXBM Ext: pitting dependent edema HD cath placed 9/6   Recent Labs Lab 04/30/16 0646  05/01/16 0430  05/01/16 1600 05/01/16 1618 05/02/16 0414 05/02/16 1515 05/03/16 0340 05/03/16 1510 05/04/16 0315  NA 144  < > 138  --  138  --  135  136 135 138 137 138  K 4.0  < > 4.2  --  4.5  --  4.6  4.6 4.3 4.2 4.0 4.2  CL 117*  < > 103  --  105  --  104  104 103 104 105 103  CO2 13*  < > 24  --  28  --  24  27 27 29 28 28   GLUCOSE 283*  < > 159*  --  118*  --  152*  153* 193* 159* 136* 143*  BUN 160*  < > 31*  --  19  --  14  14 13 18 17 19   CREATININE 10.11*  < > 2.30*  --  1.64*  --  1.36*  1.42* 1.12 0.95 0.85 0.95  ALBUMIN 2.2*  < > 2.4*  --  2.2*  --  2.2* 2.1* 2.0* 2.1* 2.2*  CALCIUM 7.5*  < > 7.4*  --  7.6*  --  7.7*  7.7* 7.6* 7.9* 7.9* 8.2*  PHOS 7.3*  < > 2.8  < > 2.9 3.1 2.6  2.6 1.9*  1.9* 1.2* 2.7 2.2*  AST 21  --   --   --   --   --   --   --   --   --   --   ALT 12*  --   --   --   --   --   --   --   --   --   --   < > = values in this interval not displayed. Liver Function Tests:  Recent Labs Lab 04/30/16 0646  05/03/16 0340  05/03/16 1510 05/04/16 0315  AST 21  --   --   --   --   ALT 12*  --   --   --   --   ALKPHOS 43  --   --   --   --   BILITOT 0.7  --   --   --   --   PROT 4.0*  --   --   --   --   ALBUMIN 2.2*  < > 2.0* 2.1* 2.2*  < > =  values in this interval not displayed. No results for input(s): LIPASE, AMYLASE in the last 168 hours. No results for input(s): AMMONIA in the last 168 hours. CBC:  Recent Labs Lab 04/30/16 0535  04/30/16 0646 05/01/16 0430 05/02/16 0414 05/03/16 0830 05/04/16 0315  WBC 6.1  --  5.9 24.5* 22.4* 22.4* 21.6*  NEUTROABS 5.7  --   --   --   --   --   --   HGB 9.6*  < > 9.9* 10.2* 8.4* 7.7* 8.3*  HCT 28.4*  < > 29.7* 29.6* 24.6* 22.9* 24.9*  MCV 87.4  --  87.9 84.6 85.7 87.4 88.6  PLT 205  --  202 154 101* 112* 129*  < > = values in this interval not displayed. Cardiac Enzymes:  Recent Labs Lab 04/30/16 0535 04/30/16 0646 04/30/16 1111 04/30/16 1615 04/30/16 2122  CKTOTAL  --  199  --   --   --   TROPONINI 0.04* 0.04* 0.12* 0.41* 0.40*   CBG:  Recent Labs Lab 05/03/16 1250 05/03/16 1505 05/03/16 2008 05/03/16 2352 05/04/16 0315  GLUCAP 143* 142* 180* 205* 138*    Iron Studies: No results for input(s): IRON, TIBC, TRANSFERRIN, FERRITIN in the last 72 hours. Studies/Results: Dg Chest Port 1 View  Result Date: 05/03/2016 CLINICAL DATA:  Shortness of Breath EXAM: PORTABLE CHEST 1 VIEW COMPARISON:  05/02/2016 FINDINGS: Cardiomediastinal silhouette is stable. Stable right adjacent line position there is endotracheal tube in place with tip 4.2 cm above the carina. NG tube in place with tip in distal stomach. Persistent hazy bilateral basilar infiltrates left greater than right. No pneumothorax. IMPRESSION: Support apparatus in place. No pneumothorax. Persistent hazy bilateral basilar infiltrates left greater than right. Electronically Signed   By: Natasha Mead M.D.   On: 05/03/2016 11:09   Dg Chest Port 1 View  Result Date: 05/03/2016 CLINICAL DATA:   Pneumonia. EXAM: PORTABLE CHEST 1 VIEW COMPARISON:  Radiographs of May 02, 2016. FINDINGS: The heart size and mediastinal contours are within normal limits. Atherosclerosis of thoracic aorta is noted. Endotracheal and nasogastric tubes are unchanged in position. Left internal jugular catheter is also unchanged with distal tip in expected position of the SVC. Right lung is clear. Stable left basilar opacity is noted consistent with pneumonia. No pneumothorax is noted. The visualized skeletal structures are unremarkable. IMPRESSION: Stable support apparatus. Stable left lower lobe pneumonia. Aortic atherosclerosis. Electronically Signed   By: Lupita Raider, M.D.   On: 05/03/2016 09:10   . chlorhexidine  15 mL Mouth Rinse BID  . darbepoetin (ARANESP) injection - DIALYSIS  100 mcg Intravenous Q Sat-HD  . docusate  100 mg Per Tube BID  . feeding supplement (VITAL HIGH PROTEIN)  1,000 mL Per Tube Q24H  . heparin  5,000 Units Subcutaneous Q8H  . insulin aspart  1-3 Units Subcutaneous Q4H  . insulin glargine  25 Units Subcutaneous Q24H  . levofloxacin (LEVAQUIN) IV  250 mg Intravenous Q24H  . mouth rinse  15 mL Mouth Rinse QID  . pantoprazole sodium  40 mg Per Tube Daily    BMET    Component Value Date/Time   NA 138 05/04/2016 0315   K 4.2 05/04/2016 0315   CL 103 05/04/2016 0315   CO2 28 05/04/2016 0315   GLUCOSE 143 (H) 05/04/2016 0315   BUN 19 05/04/2016 0315   CREATININE 0.95 05/04/2016 0315   CALCIUM 8.2 (L) 05/04/2016 0315   GFRNONAA >60 05/04/2016 0315   GFRAA >60 05/04/2016 1610  CBC    Component Value Date/Time   WBC 21.6 (H) 05/04/2016 0315   RBC 2.81 (L) 05/04/2016 0315   HGB 8.3 (L) 05/04/2016 0315   HCT 24.9 (L) 05/04/2016 0315   PLT 129 (L) 05/04/2016 0315   MCV 88.6 05/04/2016 0315   MCH 29.5 05/04/2016 0315   MCHC 33.3 05/04/2016 0315   RDW 14.5 05/04/2016 0315   LYMPHSABS 0.3 (L) 04/30/2016 0535   MONOABS 0.2 04/30/2016 0535   EOSABS 0.0 04/30/2016 0535    BASOSABS 0.0 04/30/2016 0535     Pt was found down in his underwear by neighbors and [facility] for pneumonia, sepsis, and AKI with severe metabolic and lactic acidosis.  +history of NSAIDS/lisinopril/metformen prior to admission  Assessment/Plan:  1. AKI - oliguric- presumably due to ischemic ATN in setting of low BP and concomitant ACE inhibition, NSAIDS, and metformin.  CVVHD initiated 04/30/16.  Do not know renal function baseline  1.  CVVHD with 4K/2.5Ca dialysate and replacement fluids 2. Heparin on hold due to thrombocytopenia 3. Given pitting edema and BP now high- started UF with CRRT on 9/9- to be transitioned today off CRRT - order written to stop if clots or at next filter change - vascath in since 9/6.  Will challenge with lasix today 160 q 12 and replace foley to see if will make any urine and follow  2. VDRF- per PCCM- will try some UF to assist with wean/now lasix 3. AMS- per primary 4. Pneumonia- abx per PCCM on sepsis protocol- WBC improving  5. Anemia- continue to follow. Had acute drop- will recheck today and daily - and added aranesp 6. Metabolic acidosis due to #1 and metformin.  Improved with CVVHD 7. F/E/N- initially hyperkalemic then hypo.  Stable on 4K bath. 8. Hypophos-  repletion 9/9- will give another dose today    Phylliss Strege A

## 2016-05-05 ENCOUNTER — Inpatient Hospital Stay (HOSPITAL_COMMUNITY): Payer: Non-veteran care

## 2016-05-05 LAB — BASIC METABOLIC PANEL
Anion gap: 7 (ref 5–15)
Anion gap: 11 (ref 5–15)
BUN: 55 mg/dL — AB (ref 6–20)
BUN: 51 mg/dL — AB (ref 6–20)
CALCIUM: 8.4 mg/dL — AB (ref 8.9–10.3)
CALCIUM: 8.5 mg/dL — AB (ref 8.9–10.3)
CO2: 29 mmol/L (ref 22–32)
CO2: 29 mmol/L (ref 22–32)
CREATININE: 1.62 mg/dL — AB (ref 0.61–1.24)
CREATININE: 1.8 mg/dL — AB (ref 0.61–1.24)
Chloride: 104 mmol/L (ref 101–111)
Chloride: 100 mmol/L — ABNORMAL LOW (ref 101–111)
GFR calc Af Amer: 42 mL/min — ABNORMAL LOW (ref >60)
GFR, EST AFRICAN AMERICAN: 47 mL/min — AB (ref >60)
GFR, EST NON AFRICAN AMERICAN: 41 mL/min — AB (ref >60)
GFR, EST NON AFRICAN AMERICAN: 36 mL/min — AB (ref >60)
Glucose, Bld: 217 mg/dL — ABNORMAL HIGH (ref 65–99)
Glucose, Bld: 282 mg/dL — ABNORMAL HIGH (ref 65–99)
Potassium: 2.9 mmol/L — ABNORMAL LOW (ref 3.5–5.1)
Potassium: 2.8 mmol/L — ABNORMAL LOW (ref 3.5–5.1)
SODIUM: 140 mmol/L (ref 135–145)
SODIUM: 140 mmol/L (ref 135–145)

## 2016-05-05 LAB — RENAL FUNCTION PANEL
Albumin: 2 g/dL — ABNORMAL LOW (ref 3.5–5.0)
Anion gap: 12 (ref 5–15)
BUN: 52 mg/dL — ABNORMAL HIGH (ref 6–20)
CALCIUM: 8.4 mg/dL — AB (ref 8.9–10.3)
CHLORIDE: 101 mmol/L (ref 101–111)
CO2: 27 mmol/L (ref 22–32)
CREATININE: 1.79 mg/dL — AB (ref 0.61–1.24)
GFR calc Af Amer: 42 mL/min — ABNORMAL LOW (ref >60)
GFR calc non Af Amer: 36 mL/min — ABNORMAL LOW (ref >60)
GLUCOSE: 284 mg/dL — AB (ref 65–99)
Phosphorus: 3.9 mg/dL (ref 2.5–4.6)
Potassium: 2.8 mmol/L — ABNORMAL LOW (ref 3.5–5.1)
SODIUM: 140 mmol/L (ref 135–145)

## 2016-05-05 LAB — GLUCOSE, CAPILLARY
GLUCOSE-CAPILLARY: 206 mg/dL — AB (ref 65–99)
Glucose-Capillary: 266 mg/dL — ABNORMAL HIGH (ref 65–99)
Glucose-Capillary: 215 mg/dL — ABNORMAL HIGH (ref 65–99)
Glucose-Capillary: 158 mg/dL — ABNORMAL HIGH (ref 65–99)

## 2016-05-05 LAB — CULTURE, BLOOD (ROUTINE X 2)
CULTURE: NO GROWTH
CULTURE: NO GROWTH

## 2016-05-05 LAB — CBC
HEMATOCRIT: 21.2 % — AB (ref 39.0–52.0)
HEMOGLOBIN: 7.1 g/dL — AB (ref 13.0–17.0)
MCH: 29.3 pg (ref 26.0–34.0)
MCHC: 33.5 g/dL (ref 30.0–36.0)
MCV: 87.6 fL (ref 78.0–100.0)
Platelets: 134 10*3/uL — ABNORMAL LOW (ref 150–400)
RBC: 2.42 MIL/uL — ABNORMAL LOW (ref 4.22–5.81)
RDW: 13.9 % (ref 11.5–15.5)
WBC: 11.3 10*3/uL — AB (ref 4.0–10.5)

## 2016-05-05 LAB — MAGNESIUM: MAGNESIUM: 1.9 mg/dL (ref 1.7–2.4)

## 2016-05-05 MED ORDER — IPRATROPIUM-ALBUTEROL 0.5-2.5 (3) MG/3ML IN SOLN
3.0000 mL | Freq: Four times a day (QID) | RESPIRATORY_TRACT | Status: DC
  Administered 2016-05-05 – 2016-05-06 (×3): 3 mL via RESPIRATORY_TRACT
  Filled 2016-05-05: qty 3

## 2016-05-05 MED ORDER — POTASSIUM CHLORIDE 20 MEQ/15ML (10%) PO SOLN
40.0000 meq | ORAL | Status: AC
  Administered 2016-05-05 (×2): 40 meq via ORAL
  Filled 2016-05-05 (×2): qty 30

## 2016-05-05 MED ORDER — POTASSIUM CHLORIDE 10 MEQ/50ML IV SOLN
10.0000 meq | INTRAVENOUS | Status: AC
  Administered 2016-05-05 (×6): 10 meq via INTRAVENOUS
  Filled 2016-05-05 (×6): qty 50

## 2016-05-05 NOTE — Progress Notes (Signed)
Note tube feeds off/post extubation.  Blood sugars should improve with d/c of tube feeds.  Will follow.  Thanks, Beryl MeagerJenny Shadara Lopez, RN, BC-ADM Inpatient Diabetes Coordinator Pager [phone] (8a-5p)

## 2016-05-05 NOTE — Progress Notes (Signed)
[facility] Progress Note   Assessment/ Plan:   1. AKI - non-oliguric- presumably from ischemic ATN in setting of low BP and concomitant ACE inhibition, NSAIDS, and metformin. CVVHD initiated 04/30/16 and stopped yesterday (9/10). Unfortunately, baseline function unknown. UOP picked up overnight s/p lasix--will monitor off diuretics this AM and re-assess need to re-dose this afternoon. No acute HD needs 2. VDRF- management/weaning per PCCM, appears euvolemic on exam s/p UF with CRRT and increased UOP. 3. AMS- per primary 4. Pneumonia- abx per PCCM on sepsis protocol- WBC improving  5. Anemia- continue to follow. Had acute drop- will recheck today and daily - and added aranesp 6. Metabolic acidosis due to #1 and metformin. Corrected with CVVHD 7. Hypokalemia: likely from brisk diuresis, ongoing enteral repletion.  Subjective:   No acute events overnight   Objective:   BP (!) 137/47   Pulse 99   Temp 99.3 F (37.4 C)   Resp (!) 9   Ht 5\' 7"  (1.702 m)   Wt 61.1 kg (134 lb 11.2 oz)   SpO2 99%   BMI 21.10 kg/m   Intake/Output Summary (Last 24 hours) at 05/05/16 16100811 Last data filed at 05/05/16 0700  Gross per 24 hour  Intake          2938.42 ml  Output             2640 ml  Net           298.42 ml   Weight change: -1.2 kg (-2 lb 10.3 oz)  Physical Exam: RUE:[id number]:Intubated, awake and appears comfortable JYN:WGNFACVS:Pulse regular rhythm, normal S1 and S2 Resp:Coarse bilaterally, no rales/rhonchi OZH:YQMVAbd:soft, flat, NT, BS normal Ext:No LE edema  Imaging: Dg Chest Port 1 View  Result Date: 05/03/2016 CLINICAL DATA:  Shortness of Breath EXAM: PORTABLE CHEST 1 VIEW COMPARISON:  05/02/2016 FINDINGS: Cardiomediastinal silhouette is stable. Stable right adjacent line position there is endotracheal tube in place with tip 4.2 cm above the carina. NG tube in place with tip in distal stomach. Persistent hazy bilateral basilar infiltrates left greater than right. No pneumothorax.  IMPRESSION: Support apparatus in place. No pneumothorax. Persistent hazy bilateral basilar infiltrates left greater than right. Electronically Signed   By: Natasha MeadLiviu  Pop M.D.   On: 05/03/2016 11:09    Labs: BMET  Recent Labs Lab 05/01/16 1600 05/01/16 1618 05/02/16 0414 05/02/16 1515 05/03/16 0340 05/03/16 1510 05/04/16 0315 05/05/16 0421  NA 138  --  135  136 135 138 137 138 140  K 4.5  --  4.6  4.6 4.3 4.2 4.0 4.2 2.8*  CL 105  --  104  104 103 104 105 103 101  CO2 28  --  24  27 27 29 28 28 27   GLUCOSE 118*  --  152*  153* 193* 159* 136* 143* 284*  BUN 19  --  14  14 13 18 17 19  52*  CREATININE 1.64*  --  1.36*  1.42* 1.12 0.95 0.85 0.95 1.79*  CALCIUM 7.6*  --  7.7*  7.7* 7.6* 7.9* 7.9* 8.2* 8.4*  PHOS 2.9 3.1 2.6  2.6 1.9*  1.9* 1.2* 2.7 2.2* 3.9   CBC  Recent Labs Lab 04/30/16 0535  05/02/16 0414 05/03/16 0830 05/04/16 0315 05/05/16 0422  WBC 6.1  < > 22.4* 22.4* 21.6* 11.3*  NEUTROABS 5.7  --   --   --   --   --   HGB 9.6*  < > 8.4* 7.7* 8.3* 7.1*  HCT 28.4*  < >  24.6* 22.9* 24.9* 21.2*  MCV 87.4  < > 85.7 87.4 88.6 87.6  PLT 205  < > 101* 112* 129* 134*  < > = values in this interval not displayed.  Medications:    . chlorhexidine  15 mL Mouth Rinse BID  . darbepoetin (ARANESP) injection - DIALYSIS  100 mcg Intravenous Q Sat-HD  . docusate  100 mg Per Tube BID  . feeding supplement (VITAL HIGH PROTEIN)  1,000 mL Per Tube Q24H  . insulin aspart  0-9 Units Subcutaneous Q4H  . insulin glargine  25 Units Subcutaneous Q24H  . levofloxacin (LEVAQUIN) IV  500 mg Intravenous Q48H  . mouth rinse  15 mL Mouth Rinse QID  . pantoprazole sodium  40 mg Per Tube Daily  . potassium chloride  40 mEq Oral Q3H    Zetta Bills, MD 05/05/2016, 8:11 AM

## 2016-05-05 NOTE — Progress Notes (Signed)
eLink Physician-Brief Progress Note Patient Name: Ross CanterburyDonnie Daniel DOB: 03/04/1941 MRN: [id number]   Date of Service  05/05/2016  HPI/Events of Note  K low.  eICU Interventions  Give K.     Intervention Category Major Interventions: Other:  Chester Romero 05/05/2016, 5:25 AM

## 2016-05-05 NOTE — Progress Notes (Signed)
PULMONARY / CRITICAL CARE MEDICINE   Name: Ross CanterburyDonnie Daniel MRN: [id number] DOB: [birth year]    ADMISSION DATE:  04/30/2016 CONSULTATION DATE:  04/30/16  [facility]  [facility]  CHIEF COMPLAINT:  Pneumonia, sepsis, AKI  HISTORY OF PRESENT ILLNESS:   [name] is a 75 year old with possible history of diabetes, hypertension. He was found unresponsive in his underwear outside his house by neightbours. [facility] where he was found to have pneumonia, sepsis with severe anion Metabolic acidosis, lactic acidosis, AKI (labs below). [facility] for further evaluation.    SUBJECTIVE:  No issues overnight.  Off cvvh.   VITAL SIGNS: BP 131/69   Pulse (!) 101   Temp 99.9 F (37.7 C)   Resp 13   Ht 5\' 7"  (1.702 m)   Wt 61.1 kg (134 lb 11.2 oz)   SpO2 99%   BMI 21.10 kg/m   HEMODYNAMICS:    VENTILATOR SETTINGS: Vent Mode: PSV;CPAP FiO2 (%):  [40 %] 40 % Set Rate:  [18 bmp] 18 bmp Vt Set:  [530 mL] 530 mL PEEP:  [5 cmH20] 5 cmH20 Pressure Support:  [5 cmH20-8 cmH20] 5 cmH20 Plateau Pressure:  [14 cmH20-15 cmH20] 14 cmH20  INTAKE / OUTPUT: I/O last 3 completed shifts: In: 4423.4 [I.V.:1100.4; Other:126; WG/NF:6213G/GT:2830; IV Piggyback:367] Out: 5466 [Urine:2640; Other:2826]  PHYSICAL EXAMINATION: General:  Opens eyed and does follows simple commands.  Neuro:  Cranial nerves grossly intact. No lateralizing sign. HEENT:  PERRL EOMI Cardiovascular:  s1 s2 rrr, No MRG Lungs:  Some crackles at bases Abdomen:  Soft, + BS, no r/g Musculoskeletal:  Normal tone and bulk Skin:  Intact (-) edema  LABS:  BMET  Recent Labs Lab 05/03/16 1510 05/04/16 0315 05/05/16 0421  NA 137 138 140  K 4.0 4.2 2.8*  CL 105 103 101  CO2 28 28 27   BUN 17 19 52*  CREATININE 0.85 0.95 1.79*  GLUCOSE 136* 143* 284*    Electrolytes  Recent Labs Lab 05/03/16 0340 05/03/16 1510 05/04/16 0315 05/05/16 0421  CALCIUM 7.9* 7.9* 8.2* 8.4*  MG 2.4  --  2.4  1.9  PHOS 1.2* 2.7 2.2* 3.9    CBC  Recent Labs Lab 05/03/16 0830 05/04/16 0315 05/05/16 0422  WBC 22.4* 21.6* 11.3*  HGB 7.7* 8.3* 7.1*  HCT 22.9* 24.9* 21.2*  PLT 112* 129* 134*    Coag's  Recent Labs Lab 05/02/16 0414 05/03/16 0340 05/04/16 0315  APTT 46* 41* 39*    Sepsis Markers  Recent Labs Lab 04/30/16 0548 05/01/16 1116 05/01/16 1216 05/01/16 2043 05/02/16 0414 05/03/16 0340  LATICACIDVEN 3.0*  --  2.3* 2.1*  --   --   PROCALCITON  --  33.04  --   --  21.95 10.90    ABG  Recent Labs Lab 04/30/16 0938 05/01/16 0435 05/02/16 0313  PHART 7.393 7.428 7.405  PCO2ART 27.2* 34.1 42.1  PO2ART 166* 80.1* 57.1*    Liver Enzymes  Recent Labs Lab 04/30/16 0646  05/03/16 1510 05/04/16 0315 05/05/16 0421  AST 21  --   --   --   --   ALT 12*  --   --   --   --   ALKPHOS 43  --   --   --   --   BILITOT 0.7  --   --   --   --   ALBUMIN 2.2*  < > 2.1* 2.2* 2.0*  < > = values  in this interval not displayed.  Cardiac Enzymes  Recent Labs Lab 04/30/16 1111 04/30/16 1615 04/30/16 2122  TROPONINI 0.12* 0.41* 0.40*    Glucose  Recent Labs Lab 05/04/16 1300 05/04/16 1650 05/04/16 1945 05/04/16 2355 05/05/16 0401 05/05/16 0801  GLUCAP 310* 244* 274* 252* 266* 215*    Imaging No results found.  STUDIES:  Labs studies from [facility] Ph 7.046/13/94/94% Bicarb 3 Anion gap 31 Lactic acid 4.1 WBC 28 BUN/Cr 190/13.4 K 7.8 Tylenol, alcohol, salicylate- Neg, UDS- Neg  CT head 04/29/16- No acute findings CT chest abdomen pelvis 04/29/16-Airspace consolidation in the left lower lobe consistent with acute pneumonia. Similar findings lesser degree on the right. Severe bilateral pan-lobar emphysema. No acute findings in the abdomen, pelvis.  CULTURES: Bcx X 2 9/6> (-) Ucx 9/6 >neg Sputum Cx 9/6 >(-)  ANTIBIOTICS: Vanco 9/6>>>9/8 Zosyn 9/6>>>9/8 Azithro 9/6>>>9/8 Levofloxacin 9/8>>>stop 13th  SIGNIFICANT EVENTS: 9/6- Admit with  sepsis, DKA, AKI 9/6 intubated, cvvhd 9/7- levo remains 9/8 - off levo, cvvhd , improved pcxr  LINES/TUBES:  DISCUSSION: 75 year old with diabetes, hypertension admitted with severe sepsis, multilobar pneumonia, possible aspiration in the setting of acute kidney injury, lactic acidosis, hyperkalemia. Intubated 9/6.  ASSESSMENT / PLAN:  PULMONARY A: Pneumonia, aspiration. LLL P:   Cont vent support. Doing well on PST > anticipate extubate. Check CXR Cont abx  CARDIOVASCULAR A:  Demand ischemia  P:  Keep euvolemic.   RENAL Lab Results  Component Value Date   CREATININE 1.79 (H) 05/05/2016   CREATININE 0.95 05/04/2016   CREATININE 0.85 05/03/2016    Recent Labs Lab 05/03/16 1510 05/04/16 0315 05/05/16 0421  K 4.0 4.2 2.8*     A:   AKI. Lactic acidosis Metabolic  P:   Pt got lasix on 9/10 which was dc. cvvh dc'd on 9/10.  Check K Keep euvolemic.   GASTROINTESTINAL A:   Stable P:   Cont TF ppi  HEMATOLOGIC A:   Leukocytosis, sepsis P:  Observe CBC  INFECTIOUS A:   LLL PNA P:   Cont levaquin > plan 7 days total.   ENDOCRINE CBG (last 3)   Recent Labs  05/04/16 2355 05/05/16 0401 05/05/16 0801  GLUCAP 252* 266* 215*     A:   DM P:   Cont labntus, ssi  NEUROLOGIC A:   Encephalopathy likely from sepsis, metabolic derangements. Better P:   Better.  Observe.  Prn fentanyl.   FAMILY  - Updates: No family at bedside. No family in the system. Will ask socail worker to be involved.  - Inter-disciplinary family meet or Palliative Care meeting due by:  9/6.  Ccm time 35 min   J. Alexis Frock, MD 05/05/2016, 10:22 AM [facility] Pager [phone] After 3 pm or if no answer, call [phone]

## 2016-05-05 NOTE — Procedures (Signed)
Extubation Procedure Note  Patient Details:   Name: Ross Daniel DOB: [birth year] MRN: [id number]   Airway Documentation:     Evaluation  O2 sats: stable throughout Complications: No apparent complications Patient did tolerate procedure well. Bilateral Breath Sounds: Clear   Yes   Patient extubated to 4L nasal cannula per MD order.  Positive cuff leak noted.  No evidence of stridor.  Patient able to speak post extubation.  Sats currently 10)%.  Vitals are stable.  No complications noted.  Durwin GlazeBrown, Rasool Rommel N 05/05/2016, 11:43 AM

## 2016-05-05 NOTE — Progress Notes (Signed)
Inpatient Diabetes Program Recommendations  AACE/ADA: New Consensus Statement on Inpatient Glycemic Control (2015)  Target Ranges:  Prepandial:   less than 140 mg/dL      Peak postprandial:   less than 180 mg/dL (1-2 hours)      Critically ill patients:  140 - 180 mg/dL   Lab Results  Component Value Date   GLUCAP 215 (H) 05/05/2016    Review of Glycemic ControlResults for Janece CanterburyDKINS, Madex (MRN [id number]) as of 05/05/2016 10:12  Ref. Range 05/04/2016 16:50 05/04/2016 19:45 05/04/2016 23:55 05/05/2016 04:01 05/05/2016 08:01  Glucose-Capillary Latest Ref Range: 65 - 99 mg/dL 409244 (H) 811274 (H) 914252 (H) 266 (H) 215 (H)   Diabetes history: Type 2 diabetes Outpatient Diabetes medications: Metformin 1000 mg bid, Glipizide 15 mg bid Current orders for Inpatient glycemic control:  Novolog sensitive q 4 hours, Lantus 25 units daily  Inpatient Diabetes Program Recommendations:    Note that blood sugars increased with the addition of tube feeds.  May consider adding Novolog tube feed coverage 3 units q 4 hours. Will follow.  Thanks, Beryl MeagerJenny Tamberlyn Midgley, RN, BC-ADM Inpatient Diabetes Coordinator Pager [phone] (8a-5p)

## 2016-05-05 NOTE — Progress Notes (Signed)
eLink Physician-Brief Progress Note Patient Name: Ross CanterburyDonnie Daniel DOB: 1941/07/26 MRN: [id number]   Date of Service  05/05/2016  HPI/Events of Note  K+ = 2.8 and Creatinine = 1.8.  eICU Interventions  Will replace K+.     Intervention Category Intermediate Interventions: Electrolyte abnormality - evaluation and management  [name] 05/05/2016, 5:51 PM

## 2016-05-06 DIAGNOSIS — J150 Pneumonia due to Klebsiella pneumoniae: Secondary | ICD-10-CM

## 2016-05-06 LAB — CBC
HEMATOCRIT: 22.3 % — AB (ref 39.0–52.0)
HEMOGLOBIN: 7.3 g/dL — AB (ref 13.0–17.0)
MCH: 28.7 pg (ref 26.0–34.0)
MCHC: 32.7 g/dL (ref 30.0–36.0)
MCV: 87.8 fL (ref 78.0–100.0)
PLATELETS: 174 10*3/uL (ref 150–400)
RBC: 2.54 MIL/uL — AB (ref 4.22–5.81)
RDW: 13.8 % (ref 11.5–15.5)
WBC: 10.7 10*3/uL — AB (ref 4.0–10.5)

## 2016-05-06 LAB — PHOSPHORUS: Phosphorus: 3.7 mg/dL (ref 2.5–4.6)

## 2016-05-06 LAB — GLUCOSE, CAPILLARY
GLUCOSE-CAPILLARY: 124 mg/dL — AB (ref 65–99)
GLUCOSE-CAPILLARY: 149 mg/dL — AB (ref 65–99)
GLUCOSE-CAPILLARY: 164 mg/dL — AB (ref 65–99)
GLUCOSE-CAPILLARY: 221 mg/dL — AB (ref 65–99)
GLUCOSE-CAPILLARY: 241 mg/dL — AB (ref 65–99)
GLUCOSE-CAPILLARY: 54 mg/dL — AB (ref 65–99)
Glucose-Capillary: 112 mg/dL — ABNORMAL HIGH (ref 65–99)
Glucose-Capillary: 125 mg/dL — ABNORMAL HIGH (ref 65–99)
Glucose-Capillary: 158 mg/dL — ABNORMAL HIGH (ref 65–99)
Glucose-Capillary: 216 mg/dL — ABNORMAL HIGH (ref 65–99)

## 2016-05-06 LAB — RENAL FUNCTION PANEL
ANION GAP: 11 (ref 5–15)
Albumin: 2 g/dL — ABNORMAL LOW (ref 3.5–5.0)
BUN: 48 mg/dL — ABNORMAL HIGH (ref 6–20)
CHLORIDE: 111 mmol/L (ref 101–111)
CO2: 28 mmol/L (ref 22–32)
CREATININE: 1.7 mg/dL — AB (ref 0.61–1.24)
Calcium: 9.2 mg/dL (ref 8.9–10.3)
GFR, EST AFRICAN AMERICAN: 45 mL/min — AB (ref >60)
GFR, EST NON AFRICAN AMERICAN: 38 mL/min — AB (ref >60)
Glucose, Bld: 61 mg/dL — ABNORMAL LOW (ref 65–99)
Phosphorus: 1.9 mg/dL — ABNORMAL LOW (ref 2.5–4.6)
Potassium: 3.1 mmol/L — ABNORMAL LOW (ref 3.5–5.1)
Sodium: 150 mmol/L — ABNORMAL HIGH (ref 135–145)

## 2016-05-06 LAB — CULTURE, RESPIRATORY

## 2016-05-06 LAB — CULTURE, RESPIRATORY W GRAM STAIN: Special Requests: NORMAL

## 2016-05-06 LAB — BASIC METABOLIC PANEL
Anion gap: 13 (ref 5–15)
BUN: 44 mg/dL — AB (ref 6–20)
CHLORIDE: 110 mmol/L (ref 101–111)
CO2: 25 mmol/L (ref 22–32)
CREATININE: 1.66 mg/dL — AB (ref 0.61–1.24)
Calcium: 8.9 mg/dL (ref 8.9–10.3)
GFR calc Af Amer: 46 mL/min — ABNORMAL LOW (ref >60)
GFR calc non Af Amer: 40 mL/min — ABNORMAL LOW (ref >60)
GLUCOSE: 227 mg/dL — AB (ref 65–99)
POTASSIUM: 3 mmol/L — AB (ref 3.5–5.1)
Sodium: 148 mmol/L — ABNORMAL HIGH (ref 135–145)

## 2016-05-06 LAB — MAGNESIUM: Magnesium: 1.6 mg/dL — ABNORMAL LOW (ref 1.7–2.4)

## 2016-05-06 MED ORDER — DEXTROSE 50 % IV SOLN
INTRAVENOUS | Status: AC
  Administered 2016-05-06: 25 mL
  Filled 2016-05-06: qty 50

## 2016-05-06 MED ORDER — MAGNESIUM OXIDE 400 (241.3 MG) MG PO TABS
400.0000 mg | ORAL_TABLET | Freq: Two times a day (BID) | ORAL | Status: AC
  Administered 2016-05-06 – 2016-05-08 (×6): 400 mg via ORAL
  Filled 2016-05-06 (×7): qty 1

## 2016-05-06 MED ORDER — POTASSIUM CHLORIDE 20 MEQ PO PACK
20.0000 meq | PACK | Freq: Two times a day (BID) | ORAL | Status: DC
  Filled 2016-05-06: qty 1

## 2016-05-06 MED ORDER — ORAL CARE MOUTH RINSE
15.0000 mL | Freq: Two times a day (BID) | OROMUCOSAL | Status: DC
  Administered 2016-05-08 – 2016-05-10 (×5): 15 mL via OROMUCOSAL

## 2016-05-06 MED ORDER — DEXTROSE-NACL 5-0.45 % IV SOLN
INTRAVENOUS | Status: AC
  Administered 2016-05-06 – 2016-05-07 (×2): via INTRAVENOUS

## 2016-05-06 MED ORDER — POTASSIUM PHOSPHATES 15 MMOLE/5ML IV SOLN
20.0000 mmol | Freq: Once | INTRAVENOUS | Status: AC
  Administered 2016-05-06: 20 mmol via INTRAVENOUS
  Filled 2016-05-06: qty 6.67

## 2016-05-06 MED ORDER — RESOURCE THICKENUP CLEAR PO POWD
ORAL | Status: DC | PRN
  Filled 2016-05-06: qty 125

## 2016-05-06 MED ORDER — MAGNESIUM SULFATE 2 GM/50ML IV SOLN
2.0000 g | Freq: Once | INTRAVENOUS | Status: AC
  Administered 2016-05-06: 2 g via INTRAVENOUS
  Filled 2016-05-06: qty 50

## 2016-05-06 MED ORDER — IPRATROPIUM-ALBUTEROL 0.5-2.5 (3) MG/3ML IN SOLN
3.0000 mL | Freq: Four times a day (QID) | RESPIRATORY_TRACT | Status: DC
  Administered 2016-05-06 – 2016-05-08 (×8): 3 mL via RESPIRATORY_TRACT
  Filled 2016-05-06 (×8): qty 3

## 2016-05-06 MED ORDER — POTASSIUM CHLORIDE 20 MEQ/15ML (10%) PO SOLN
20.0000 meq | Freq: Two times a day (BID) | ORAL | Status: DC
  Filled 2016-05-06: qty 15

## 2016-05-06 MED ORDER — CHLORHEXIDINE GLUCONATE 0.12 % MT SOLN
15.0000 mL | Freq: Two times a day (BID) | OROMUCOSAL | Status: DC
  Administered 2016-05-06 – 2016-05-10 (×8): 15 mL via OROMUCOSAL
  Filled 2016-05-06 (×8): qty 15

## 2016-05-06 MED ORDER — POTASSIUM CHLORIDE 20 MEQ PO PACK
20.0000 meq | PACK | Freq: Two times a day (BID) | ORAL | Status: AC
Start: 2016-05-06 — End: 2016-05-07
  Administered 2016-05-06 – 2016-05-07 (×2): 20 meq via ORAL
  Filled 2016-05-06 (×2): qty 1

## 2016-05-06 MED ORDER — FAMOTIDINE 20 MG PO TABS
20.0000 mg | ORAL_TABLET | Freq: Two times a day (BID) | ORAL | Status: DC
  Administered 2016-05-06 – 2016-05-10 (×7): 20 mg via ORAL
  Filled 2016-05-06 (×8): qty 1

## 2016-05-06 NOTE — Progress Notes (Signed)
Inpatient Diabetes Program Recommendations  AACE/ADA: New Consensus Statement on Inpatient Glycemic Control (2015)  Target Ranges:  Prepandial:   less than 140 mg/dL      Peak postprandial:   less than 180 mg/dL (1-2 hours)      Critically ill patients:  140 - 180 mg/dL   Lab Results  Component Value Date   GLUCAP 149 (H) 05/06/2016    Review of Glycemic ControlResults for Janece CanterburyDKINS, Matthe (MRN [id number]) as of 05/06/2016 10:16  Ref. Range 05/05/2016 15:22 05/05/2016 20:31 05/05/2016 23:50 05/06/2016 03:54 05/06/2016 04:44 05/06/2016 06:48 05/06/2016 07:48  Glucose-Capillary Latest Ref Range: 65 - 99 mg/dL 086158 (H) 578164 (H) 469124 (H) 54 (L) 112 (H) 125 (H) 149 (H)   Inpatient Diabetes Program Recommendations:     Please reduce Lantus to 12 units q HS.   Thanks, Beryl MeagerJenny Myrene Bougher, RN, BC-ADM Inpatient Diabetes Coordinator Pager [phone]

## 2016-05-06 NOTE — Progress Notes (Signed)
[facility] Progress Note   Assessment/ Plan:   1. AKI -  likely from ischemic ATN in setting of low BP and concomitant ACE inhibition, NSAIDS, and metformin. Non-oliguric off diuretics.  Was previously onCVVHD between 04/30/16 and 05/04/16. Renal function remains essentially unchanged overnight-his baseline renal function is unknown and this might be indeed where he plateaus. No acute dialysis needs noted at this time. 2. VDRF- extubated yesterday without any problems overnight, awaiting swallow evaluation. 3. Hypernatremia: Encouraged free-water intake after swallow evaluation past. Otherwise, will need hypotonic IV fluids such as half-normal saline to replace at least 3 L of water deficit. 4. Pneumonia- abx per PCCM on sepsis protocol- WBC improving  5. Anemia- monitor on Aranesp, no overt losses noted 6. Hypokalemia: Ongoing enteral repletion, magnesium stores borderline-will give oral magnesium oxide.  Subjective:   Denies any complaints-appears somewhat slow in his responses to questions. No acute events reported overnight per nursing    Objective:   BP (!) 116/56 (BP Location: Right Arm)   Pulse 100   Temp 99.5 F (37.5 C) (Core (Comment))   Resp 15   Ht 5\' 7"  (1.702 m)   Wt 58.2 kg (128 lb 4.9 oz)   SpO2 92%   BMI 20.10 kg/m   Intake/Output Summary (Last 24 hours) at 05/06/16 0759 Last data filed at 05/06/16 0700  Gross per 24 hour  Intake             1407 ml  Output             2568 ml  Net            -1161 ml   Weight change: -2.9 kg (-6 lb 6.3 oz)  Physical Exam: ZOX:WRUEAVW nebulizer treatment, awake and appears comfortable UJW:JXBJY regular rhythm, normal S1 and S2 Resp:Coarse bilaterally, no rales/rhonchi NWG:NFAO, flat, NT, BS normal Ext:No LE edema  Imaging: Dg Chest Port 1 View  Result Date: 05/05/2016 CLINICAL DATA:  Acute respiratory failure. EXAM: PORTABLE CHEST 1 VIEW COMPARISON:  05/03/2016. FINDINGS: 1104 hours. Endotracheal tube tip  is 4.2 cm above the base the carina. Left IJ central line tip overlies the mid to distal SVC level. The NG tube passes into the stomach although the distal tip position is not included on the film. Patchy airspace disease in the left lung base persists but appears slightly improved in the interval. No focal airspace disease in the right lung. The cardiopericardial silhouette is within normal limits for size. The visualized bony structures of the thorax are intact. Telemetry leads overlie the chest. IMPRESSION: Slight interval improvement in left basilar aeration. Electronically Signed   By: Kennith Center M.D.   On: 05/05/2016 11:21    Labs: BMET  Recent Labs Lab 05/02/16 0414 05/02/16 1515 05/03/16 0340 05/03/16 1510 05/04/16 0315 05/05/16 0421 05/05/16 1200 05/05/16 1249 05/06/16 0355  NA 135  136 135 138 137 138 140 140 140 150*  K 4.6  4.6 4.3 4.2 4.0 4.2 2.8* 2.9* 2.8* 3.1*  CL 104  104 103 104 105 103 101 104 100* 111  CO2 24  27 27 29 28 28 27 29 29 28   GLUCOSE 152*  153* 193* 159* 136* 143* 284* 217* 282* 61*  BUN 14  14 13 18 17 19  52* 55* 51* 48*  CREATININE 1.36*  1.42* 1.12 0.95 0.85 0.95 1.79* 1.62* 1.80* 1.70*  CALCIUM 7.7*  7.7* 7.6* 7.9* 7.9* 8.2* 8.4* 8.4* 8.5* 9.2  PHOS 2.6  2.6 1.9*  1.9* 1.2* 2.7 2.2* 3.9  --   --  1.9*   CBC  Recent Labs Lab 04/30/16 0535  05/03/16 0830 05/04/16 0315 05/05/16 0422 05/06/16 0355  WBC 6.1  < > 22.4* 21.6* 11.3* 10.7*  NEUTROABS 5.7  --   --   --   --   --   HGB 9.6*  < > 7.7* 8.3* 7.1* 7.3*  HCT 28.4*  < > 22.9* 24.9* 21.2* 22.3*  MCV 87.4  < > 87.4 88.6 87.6 87.8  PLT 205  < > 112* 129* 134* 174  < > = values in this interval not displayed.  Medications:    . chlorhexidine  15 mL Mouth Rinse BID  . darbepoetin (ARANESP) injection - DIALYSIS  100 mcg Intravenous Q Sat-HD  . docusate  100 mg Per Tube BID  . feeding supplement (VITAL HIGH PROTEIN)  1,000 mL Per Tube Q24H  . insulin aspart  0-9 Units  Subcutaneous Q4H  . insulin glargine  25 Units Subcutaneous Q24H  . ipratropium-albuterol  3 mL Nebulization QID  . mouth rinse  15 mL Mouth Rinse QID  . pantoprazole sodium  40 mg Per Tube Daily  . potassium phosphate IVPB (mmol)  20 mmol Intravenous Once    Zetta BillsJay Rowene Suto, MD 05/06/2016, 7:59 AM

## 2016-05-06 NOTE — Progress Notes (Signed)
CSW received return phone call from Investment banker, corporateroperty Manager, Jesusita OkaKen Sheetz [phone]) who reports that he initially found Pt in his apartment after neighbors reported concern that they had not seen him in a while but could hear him in his apartment. He reports that he found Pt with pill bottle and is unsure of how many or what he took. Mr. Lindie SpruceSheetz reports that at baseline, Patient is a very sharp person. 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 Mr. Lindie SpruceSheetz reports that Patient has no known family but reports that he does have friends that have been asking about him daily. Mr. Lindie SpruceSheetz reports that he has never seen Patient confused and notes that confusion is "not like him". Mr. Lindie SpruceSheetz reports that he is available for any further questions we may have. He requests that we let Patient know that they have been thinking and asking about him at [facility]. Mr. Lindie SpruceSheetz reports that Patient is a veteran and has [employer] insurance. CSW continues to follow.          Lance MussAshley Gardner,MSW, LCSW [facility] ED/77M Clinical Social Worker [phone]

## 2016-05-06 NOTE — Progress Notes (Signed)
Nutrition Follow-up  DOCUMENTATION CODES:   Not applicable  INTERVENTION:  Will monitor intake closely when diet advanced. Encouraged adequate intake of protein and calories - unsure if patient understood.  Will monitor for need for oral nutrition supplement (ensure).    NUTRITION DIAGNOSIS:   Inadequate oral intake related to inability to eat as evidenced by NPO status.  Ongoing.  GOAL:   Patient will meet greater than or equal to 90% of their needs  Not meeting.  MONITOR:   Vent status, Labs, Weight trends, TF tolerance, I & O's  REASON FOR ASSESSMENT:   Consult Enteral/tube feeding initiation and management  ASSESSMENT:   75 year old with possible history of diabetes, hypertension. He was found unresponsive in his underwear outside his house by neightbours. [facility] where he was found to have pneumonia, sepsis with severe anion metabolic acidosis, lactic acidosis, AKI (labs below). [facility].   Patient was on CVVHD from 9/6-9/10. Extubated 9/11, TF discontinued at time of extubation. Awaiting swallow evaluation.   Patient reports he is very hard of hearing and was unable to communicate well at time of assessment. He reports UBW is 145 lbs. Patient has lost 3.1 kg (5% body weight) over 1 week (since 9/6). Will continue to trend.   Medications reviewed and include: Novolog sliding scale Q4hrs, Magnesium oxide 400 mg BID, pantoprazole, potassium phosphate 20 mmol in D5 500 ml one time today.   Labs reviewed: CBG 54-206 past 24 hrs, Potassium 3.1, Phosphorus 1.9, Magnesium 1.6.  Discussed plan with RN.   Diet Order:  Diet NPO time specified  Skin:  Reviewed, no issues  Last BM:  9/6  Height:   Ht Readings from Last 1 Encounters:  04/30/16 5\' 7"  (1.702 m)    Weight:   Wt Readings from Last 1 Encounters:  05/06/16 128 lb 4.9 oz (58.2 kg)    Ideal Body Weight:  67.3 kg  BMI:  Body mass index is  20.1 kg/m.  Estimated Nutritional Needs:   Kcal:  1500-1700  Protein:  70-90 grams  Fluid:  1.5-1.7 L/day  EDUCATION NEEDS:   No education needs identified at this time  Helane RimaLeanne Jaedyn Marrufo, MS, RD, LDN Pager: [phone] After Hours Pager: [phone]

## 2016-05-06 NOTE — Progress Notes (Signed)
eLink Physician-Brief Progress Note Patient Name: Ross CanterburyDonnie Daniel DOB: 1941/06/16 MRN: [id number]   Date of Service  05/06/2016  HPI/Events of Note  Electrolytes low.  eICU Interventions  Giving K, Mg, Ph.     Intervention Category Major Interventions: Other:  Ross Daniel 05/06/2016, 5:04 AM

## 2016-05-06 NOTE — Progress Notes (Signed)
Per [facility] and confirmed by Patient's friend, Moshe CiproHoward Lee Turner, [phone]), [facility] lives at [facility] ([address] [place], [place] Per EMS and Patient's friend, there is no next of kin. No further information provided. CSW has contacted Investment banker, corporateproperty manager and left HIPAA compliant voice message was left requesting return phone call. CSW continues to attempt to obtain information regarding Pt.      Lance MussAshley Gardner,MSW, LCSW [facility] ED/71M Clinical Social Worker [phone]

## 2016-05-06 NOTE — Progress Notes (Signed)
PULMONARY / CRITICAL CARE MEDICINE   Name: Ross Daniel MRN: [id number] DOB: December 02, 1940    ADMISSION DATE:  04/30/2016 CONSULTATION DATE:  04/30/16  [facility]  [facility]  CHIEF COMPLAINT:  Pneumonia, sepsis, AKI  HISTORY OF PRESENT ILLNESS:   [name] is a 75 year old with possible history of diabetes, hypertension. He was found unresponsive in his underwear outside his house by neightbours. [facility] where he was found to have pneumonia, sepsis with severe anion Metabolic acidosis, lactic acidosis, AKI (labs below). [facility].    SUBJECTIVE:  No issues. remains confused and disoriented. Comfortable.   VITAL SIGNS: BP (!) 116/56 (BP Location: Right Arm)   Pulse 100   Temp 99.5 F (37.5 C) (Core (Comment))   Resp 15   Ht 5\' 7"  (1.702 m)   Wt 58.2 kg (128 lb 4.9 oz)   SpO2 97%   BMI 20.10 kg/m   HEMODYNAMICS:    VENTILATOR SETTINGS:    INTAKE / OUTPUT: I/O last 3 completed shifts: In: 2813 [P.O.:60; I.V.:656.5; Other:86; XB/MW:4132.4G/GT:1442.5; IV Piggyback:568] Out: 4856 [Urine:3857; Stool:999]  PHYSICAL EXAMINATION: General:  Awake, answers simple questions. Confused. NAD.  Neuro:  Cranial nerves grossly intact. No lateralizing sign. HEENT:  PERRL EOMI Cardiovascular:  s1 s2 rrr, No MRG Lungs:  Some crackles at bases Abdomen:  Soft, + BS, no r/g Musculoskeletal:  Normal tone and bulk Skin:  Intact (-) edema  LABS:  BMET  Recent Labs Lab 05/05/16 1200 05/05/16 1249 05/06/16 0355  NA 140 140 150*  K 2.9* 2.8* 3.1*  CL 104 100* 111  CO2 29 29 28   BUN 55* 51* 48*  CREATININE 1.62* 1.80* 1.70*  GLUCOSE 217* 282* 61*    Electrolytes  Recent Labs Lab 05/04/16 0315 05/05/16 0421 05/05/16 1200 05/05/16 1249 05/06/16 0355  CALCIUM 8.2* 8.4* 8.4* 8.5* 9.2  MG 2.4 1.9  --   --  1.6*  PHOS 2.2* 3.9  --   --  1.9*    CBC  Recent Labs Lab 05/04/16 0315 05/05/16 0422 05/06/16 0355   WBC 21.6* 11.3* 10.7*  HGB 8.3* 7.1* 7.3*  HCT 24.9* 21.2* 22.3*  PLT 129* 134* 174    Coag's  Recent Labs Lab 05/02/16 0414 05/03/16 0340 05/04/16 0315  APTT 46* 41* 39*    Sepsis Markers  Recent Labs Lab 04/30/16 0548 05/01/16 1116 05/01/16 1216 05/01/16 2043 05/02/16 0414 05/03/16 0340  LATICACIDVEN 3.0*  --  2.3* 2.1*  --   --   PROCALCITON  --  33.04  --   --  21.95 10.90    ABG  Recent Labs Lab 04/30/16 0938 05/01/16 0435 05/02/16 0313  PHART 7.393 7.428 7.405  PCO2ART 27.2* 34.1 42.1  PO2ART 166* 80.1* 57.1*    Liver Enzymes  Recent Labs Lab 04/30/16 0646  05/04/16 0315 05/05/16 0421 05/06/16 0355  AST 21  --   --   --   --   ALT 12*  --   --   --   --   ALKPHOS 43  --   --   --   --   BILITOT 0.7  --   --   --   --   ALBUMIN 2.2*  < > 2.2* 2.0* 2.0*  < > = values in this interval not displayed.  Cardiac Enzymes  Recent Labs Lab 04/30/16 1111 04/30/16 1615 04/30/16 2122  TROPONINI 0.12* 0.41* 0.40*  Glucose  Recent Labs Lab 05/05/16 2031 05/05/16 2350 05/06/16 0354 05/06/16 0444 05/06/16 0648 05/06/16 0748  GLUCAP 164* 124* 54* 112* 125* 149*    Imaging Dg Chest Port 1 View  Result Date: 05/05/2016 CLINICAL DATA:  Acute respiratory failure. EXAM: PORTABLE CHEST 1 VIEW COMPARISON:  05/03/2016. FINDINGS: 1104 hours. Endotracheal tube tip is 4.2 cm above the base the carina. Left IJ central line tip overlies the mid to distal SVC level. The NG tube passes into the stomach although the distal tip position is not included on the film. Patchy airspace disease in the left lung base persists but appears slightly improved in the interval. No focal airspace disease in the right lung. The cardiopericardial silhouette is within normal limits for size. The visualized bony structures of the thorax are intact. Telemetry leads overlie the chest. IMPRESSION: Slight interval improvement in left basilar aeration. Electronically Signed   By:  Kennith Center M.D.   On: 05/05/2016 11:21    STUDIES:  Labs studies from [facility] Ph 7.046/13/94/94% Bicarb 3 Anion gap 31 Lactic acid 4.1 WBC 28 BUN/Cr 190/13.4 K 7.8 Tylenol, alcohol, salicylate- Neg, UDS- Neg  CT head 04/29/16- No acute findings CT chest abdomen pelvis 04/29/16-Airspace consolidation in the left lower lobe consistent with acute pneumonia. Similar findings lesser degree on the right. Severe bilateral pan-lobar emphysema. No acute findings in the abdomen, pelvis.  CULTURES: Bcx X 2 9/6> (-) Ucx 9/6 >neg Sputum Cx 9/6 >(-) Klebsiella and Serratia  ANTIBIOTICS: Vanco 9/6>>>9/8 Zosyn 9/6>>>9/8 Azithro 9/6>>>9/8 Levofloxacin 9/8>>>stop 12th  SIGNIFICANT EVENTS: 9/6- Admit with sepsis, DKA, AKI 9/6 intubated, cvvhd 9/7- levo remains 9/8 - off levo, cvvhd , improved pcxr  LINES/TUBES:  DISCUSSION: 75 year old with diabetes, hypertension admitted with severe sepsis, multilobar pneumonia, possible aspiration in the setting of acute kidney injury, lactic acidosis, hyperkalemia. Intubated 9/6. Extubated on 9/11.   ASSESSMENT / PLAN:  PULMONARY A: Pneumonia, aspiration. LLL. Klebsiella and Serratia.  P:   Will finish abx today. Has received 7d of abx.  IS Duoneb qid.  Asp precaution  CARDIOVASCULAR A:  Demand ischemia P:  No issues  RENAL Lab Results  Component Value Date   CREATININE 1.70 (H) 05/06/2016   CREATININE 1.80 (H) 05/05/2016   CREATININE 1.62 (H) 05/05/2016    Recent Labs Lab 05/05/16 1200 05/05/16 1249 05/06/16 0355  K 2.9* 2.8* 3.1*     A:   AKI 2/2 ischemia/sepsis/NSAID and lisinopril use Not sure if pt has CKD.  Hypernatremia P:   Needs swallow eval > if he passes, encourage to take PO. If he fails, will need IVF.  Observe.    GASTROINTESTINAL A:   Stable P:   Swallow evaln. If he passes, switch PPI to Pepcid.   HEMATOLOGIC A:   Leukocytosis, sepsis P:  Observe CBC  INFECTIOUS A:   LLL PNA. Asp pna.  Klebsiella and Serratia. P:   Finish abx today.   ENDOCRINE CBG (last 3)   Recent Labs  05/06/16 0444 05/06/16 0648 05/06/16 0748  GLUCAP 112* 125* 149*     A:   DM P:   Will hold Lantus. Keep on SSI. Pt is NPO 2/2 swallow evaln. Was on home DM meds.   NEUROLOGIC A:   Encephalopathy likely from sepsis, metabolic derangements. Better. Maybe at baseline.  P:   MV daily.  Remains confused but maybe his baseline.   FAMILY  - Updates: No family at bedside. No family in the system.  socail worker to be involved.  -  Inter-disciplinary family meet or Palliative Care meeting due by:  9/6.  Plan to transfer to [place] aware and will be primary starting 9/13. PCCM off on 9/13.   Pollie Meyer, MD 05/06/2016, 10:08 AM [facility] Pager [phone] After 3 pm or if no answer, call [phone]

## 2016-05-06 NOTE — Progress Notes (Addendum)
CSW had CSW Director utilize people finder database to attempt to locate family. CSW contacted name listed as relative. Per contact, she has a husband with same name who passed away in November and would be Pt's same age. She reports that she has a son with the same name that lives with her. She cannot recall any other family with that name. CSW apologized and thanked contact for her time. CSW has staffed with Chief Executive OfficerCSW Director. Patient is presently confused and unable to provide any information. CSW attempted contact, Moshe CiproHoward Lee Turner [phone]) listed as Pt's best friend. CSW left HIPAA compliant voice message. CSW has contacted [facility] ([facility] [phone]) to try to confirm Patient's identity and is awaiting a return phone call. CSW continues to follow.     Lance MussAshley Gardner,MSW, LCSW [facility] ED/47M Clinical Social Worker [phone]

## 2016-05-06 NOTE — Evaluation (Signed)
Clinical/Bedside Swallow Evaluation Patient Details  Name: Ross CanterburyDonnie Daniel MRN: [id number] Date of Birth: 01-29-1941  Today's Date: 05/06/2016 Time: SLP Start Time (ACUTE ONLY): 1505 SLP Stop Time (ACUTE ONLY): 1520 SLP Time Calculation (min) (ACUTE ONLY): 15 min  Past Medical History: History reviewed. No pertinent past medical history. Past Surgical History: No past surgical history on file. HPI:  75 year old with diabetes, hypertension, admitted with severe sepsis, multilobar pna, possible aspiration. He was found unresponsive in his underwear outside his house by neighbors. [facility]  Intubated 9/6-11.   Assessment / Plan / Recommendation Clinical Impression  Pt presents with confusion, difficulty following commands.  Spontaneous voicing is clear and strong with good quality despite six-day intubation.  RR is well within parameters of adequate respiratory/swallowing reciprocity.  Trials of nectar-thick liquids, purees tolerated well with good oral attention, no overt s/s of aspiration.  Thin liquids led to multiple sub-swallows per bolus.  Given length of intubation and cognitive deficits, recommend cautiously initiating a dysphagia 1 diet with nectar-thick liquids.  Meds whole in puree.  Please hold tray if pt develops any s/s of aspiration.  SLP will follow for diet progression, safety, and to evaluate necessity of instrumental swallow study.  D/W RN.     Aspiration Risk  Mild aspiration risk    Diet Recommendation   dysphagia 1, nectar thick liquids  Medication Administration: Whole meds with puree    Other  Recommendations Oral Care Recommendations: Oral care BID Other Recommendations: Order thickener from pharmacy   Follow up Recommendations   (tba)    Frequency and Duration min 2x/week  2 weeks       Prognosis Prognosis for Safe Diet Advancement: Fair      Swallow Study   General Date of Onset: 04/30/16 HPI: 75 year old  with diabetes, hypertension, admitted with severe sepsis, multilobar pna, possible aspiration. He was found unresponsive in his underwear outside his house by neighbors. [facility].  Intubated 9/6-11. Type of Study: Bedside Swallow Evaluation Previous Swallow Assessment: none per records Diet Prior to this Study: NPO Temperature Spikes Noted: Yes Respiratory Status: Nasal cannula History of Recent Intubation: Yes Length of Intubations (days): 5 days Date extubated: 05/05/16 Behavior/Cognition: Alert;Confused;Doesn't follow directions Oral Cavity Assessment: Within Functional Limits Oral Care Completed by SLP: No Oral Cavity - Dentition: Adequate natural dentition;Missing dentition Self-Feeding Abilities: Able to feed self;Needs assist Patient Positioning: Upright in bed Baseline Vocal Quality: Normal Volitional Cough: Cognitively unable to elicit Volitional Swallow: Unable to elicit    Oral/Motor/Sensory Function Overall Oral Motor/Sensory Function:  (symmetric at baseline)   Ice Chips Ice chips: Not tested   Thin Liquid Thin Liquid: Impaired Presentation: Cup Pharyngeal  Phase Impairments: Multiple swallows    Nectar Thick Nectar Thick Liquid: Within functional limits Presentation: Cup   Honey Thick Honey Thick Liquid: Not tested   Puree Puree: Within functional limits   Solid   GO   Solid: Not tested       Marchelle FolksAmanda L. Samson Fredericouture, [place] CCC/SLP Pager [phone]  Blenda MountsCouture, Kol Consuegra Laurice 05/06/2016,3:43 PM

## 2016-05-07 LAB — RENAL FUNCTION PANEL
ALBUMIN: 2 g/dL — AB (ref 3.5–5.0)
ANION GAP: 10 (ref 5–15)
BUN: 35 mg/dL — ABNORMAL HIGH (ref 6–20)
CO2: 27 mmol/L (ref 22–32)
Calcium: 9 mg/dL (ref 8.9–10.3)
Chloride: 110 mmol/L (ref 101–111)
Creatinine, Ser: 1.57 mg/dL — ABNORMAL HIGH (ref 0.61–1.24)
GFR calc Af Amer: 49 mL/min — ABNORMAL LOW (ref >60)
GFR, EST NON AFRICAN AMERICAN: 42 mL/min — AB (ref >60)
Glucose, Bld: 145 mg/dL — ABNORMAL HIGH (ref 65–99)
PHOSPHORUS: 3.4 mg/dL (ref 2.5–4.6)
POTASSIUM: 3.6 mmol/L (ref 3.5–5.1)
Sodium: 147 mmol/L — ABNORMAL HIGH (ref 135–145)

## 2016-05-07 LAB — GLUCOSE, CAPILLARY
GLUCOSE-CAPILLARY: 169 mg/dL — AB (ref 65–99)
Glucose-Capillary: 140 mg/dL — ABNORMAL HIGH (ref 65–99)
Glucose-Capillary: 137 mg/dL — ABNORMAL HIGH (ref 65–99)
Glucose-Capillary: 193 mg/dL — ABNORMAL HIGH (ref 65–99)
Glucose-Capillary: 365 mg/dL — ABNORMAL HIGH (ref 65–99)
Glucose-Capillary: 270 mg/dL — ABNORMAL HIGH (ref 65–99)
Glucose-Capillary: 236 mg/dL — ABNORMAL HIGH (ref 65–99)

## 2016-05-07 LAB — CBC
HEMATOCRIT: 22.7 % — AB (ref 39.0–52.0)
HEMOGLOBIN: 7.4 g/dL — AB (ref 13.0–17.0)
MCH: 29.4 pg (ref 26.0–34.0)
MCHC: 32.6 g/dL (ref 30.0–36.0)
MCV: 90.1 fL (ref 78.0–100.0)
Platelets: 257 10*3/uL (ref 150–400)
RBC: 2.52 MIL/uL — AB (ref 4.22–5.81)
RDW: 14.1 % (ref 11.5–15.5)
WBC: 11 10*3/uL — AB (ref 4.0–10.5)

## 2016-05-07 MED ORDER — ENOXAPARIN SODIUM 40 MG/0.4ML ~~LOC~~ SOLN
40.0000 mg | SUBCUTANEOUS | Status: DC
  Administered 2016-05-07 – 2016-05-10 (×4): 40 mg via SUBCUTANEOUS
  Filled 2016-05-07 (×4): qty 0.4

## 2016-05-07 MED ORDER — DEXTROSE-NACL 5-0.45 % IV SOLN
INTRAVENOUS | Status: AC
  Administered 2016-05-07 (×2): via INTRAVENOUS

## 2016-05-07 NOTE — Progress Notes (Signed)
[facility] Progress Note   Assessment/ Plan:   1. AKI -  suspected ischemic ATN with hypotension and concomitant ACE inhibition, NSAIDS, and metformin. Remains non-oliguric off diuretics. RequiredCVVHD between 04/30/16 and 05/04/16. Renal function remains essentially unchanged overnight/slightly better overnight-his baseline renal function is unknown and this might be indeed where he plateaus. No acute dialysis needs noted at this time. 2. VDRF- extubated and so far without any problems overnight. 3. Hypernatremia: Encouraged free-water intake after swallow evaluation past. Otherwise, will need hypotonic IV fluids such as half-normal saline to replace at least 3 L of water deficit. 4. Pneumonia- s/p BSABx and appears that sepsis has resolved, WBC counts remain upper normal around 11K.   5. Anemia- monitor on Aranesp, no overt losses noted 6. Hypokalemia: corrected with repletion, magnesium stores borderline-on oral magnesium oxide.  Will sign off at this time and have him follow up with his PCP for his CKD that likely is from underlying DM/HTN---he may be referred for additional renal care if needed. Please call with questions or concerns.  Subjective:   Confused and unable to clearly answer anything beyond "how are you?"   Objective:   BP (!) 130/47 (BP Location: Right Arm)   Pulse 88   Temp 98.9 F (37.2 C) (Oral)   Resp 17   Ht 5\' 8"  (1.727 m)   Wt 63.3 kg (139 lb 8 oz)   SpO2 93%   BMI 21.21 kg/m   Intake/Output Summary (Last 24 hours) at 05/07/16 1108 Last data filed at 05/07/16 0930  Gross per 24 hour  Intake             1025 ml  Output             1445 ml  Net             -420 ml   Weight change: 3.898 kg (8 lb 9.5 oz)  Physical Exam: ZOX:WRUEA and eating ice chips, appears comfortable VWU:JWJXB regular rhythm, normal S1 and S2 Resp:Coarse bilaterally, no rales/rhonchi JYN:WGNF, flat, NT, BS normal Ext:No LE edema  Imaging: Dg Chest Port 1  View  Result Date: 05/05/2016 CLINICAL DATA:  Acute respiratory failure. EXAM: PORTABLE CHEST 1 VIEW COMPARISON:  05/03/2016. FINDINGS: 1104 hours. Endotracheal tube tip is 4.2 cm above the base the carina. Left IJ central line tip overlies the mid to distal SVC level. The NG tube passes into the stomach although the distal tip position is not included on the film. Patchy airspace disease in the left lung base persists but appears slightly improved in the interval. No focal airspace disease in the right lung. The cardiopericardial silhouette is within normal limits for size. The visualized bony structures of the thorax are intact. Telemetry leads overlie the chest. IMPRESSION: Slight interval improvement in left basilar aeration. Electronically Signed   By: Kennith Center M.D.   On: 05/05/2016 11:21    Labs: BMET  Recent Labs Lab 05/03/16 0340 05/03/16 1510 05/04/16 0315 05/05/16 0421 05/05/16 1200 05/05/16 1249 05/06/16 0355 05/06/16 1500 05/07/16 0515  NA 138 137 138 140 140 140 150* 148* 147*  K 4.2 4.0 4.2 2.8* 2.9* 2.8* 3.1* 3.0* 3.6  CL 104 105 103 101 104 100* 111 110 110  CO2 29 28 28 27 29 29 28 25 27   GLUCOSE 159* 136* 143* 284* 217* 282* 61* 227* 145*  BUN 18 17 19  52* 55* 51* 48* 44* 35*  CREATININE 0.95 0.85 0.95 1.79* 1.62* 1.80* 1.70* 1.66* 1.57*  CALCIUM 7.9* 7.9* 8.2* 8.4* 8.4* 8.5* 9.2 8.9 9.0  PHOS 1.2* 2.7 2.2* 3.9  --   --  1.9* 3.7 3.4   CBC  Recent Labs Lab 05/04/16 0315 05/05/16 0422 05/06/16 0355 05/07/16 0515  WBC 21.6* 11.3* 10.7* 11.0*  HGB 8.3* 7.1* 7.3* 7.4*  HCT 24.9* 21.2* 22.3* 22.7*  MCV 88.6 87.6 87.8 90.1  PLT 129* 134* 174 257    Medications:    . chlorhexidine  15 mL Mouth Rinse BID  . famotidine  20 mg Oral BID  . insulin aspart  0-9 Units Subcutaneous Q4H  . ipratropium-albuterol  3 mL Nebulization QID  . magnesium oxide  400 mg Oral BID  . mouth rinse  15 mL Mouth Rinse q12n4p    Zetta BillsJay Arvada Seaborn, MD 05/07/2016, 11:08 AM

## 2016-05-07 NOTE — Progress Notes (Signed)
PROGRESS NOTE    Ross CanterburyDonnie Daniel  KGM:[id number] DOB: 1941-07-04 DOA: 04/30/2016 PCP: No primary care provider on file.  Outpatient Specialists:     Brief Narrative:  75 year old male with known history of diabetes mellitus since 2009  Found unresponsive 04/30/2016 outside the house [facility] with pneumonia and severe sepsis severe anion gap metabolic acidosis and acute kidney injury Initial bicarbonate 3, potassium 8  Blood pressure Anion gap 31 WBC 28 PH 7.046 BUN/creatinine 190/13.4 [facility]. Sputum cultures grew Klebsiella and Serratia patient initially started on broad spectrum vancomycin/Zosyn/azithromycin and  9/8 to Levaquin compelting on 05/06/16 Initially intubated and because of acute kidney injury kept on CVVHD and these were subsequently discontinued on 05/02/16 Nephrology is still following  transferred to hospitalist service 05/07/2016   Sunj Disoriented and cannot really tell me much Trying to drink out of Jug of ice. Not making much sense  Assessment & Plan:   Active Problems:   AKI (acute kidney injury) (HCC)   Acute respiratory failure (HCC)   Respiratory compromise   Pneumonia   Pneumonia due to Klebsiella pneumoniae (HCC)  Left lower lobe pneumonia-initially was on broad-spectrum antibiotics and cultures grew out Klebsiella and Serratia completed by mouth Levaquin subsequently as well on 05/06/16 WBC 11 range however no overt evidence is of high fever chills clinically appearing to improve without high-grade fevers The patient was intubated and on pressors until 05/05/2016 from outside hospital This has clinically rewsolved-patient no longer coughing or in distress  Demand ischemia Troponin peaked at 0.40 on 9/6 and no further workup was initiated  Dysphagia Speech therapy saw the patient 05/06/2016 recommending home meds with  pure some mild cognitive deficits and follow-up  speech evaluation Also recommending thickener and dysphagia 1 diet + nectar thick liquids Unclear if patient can cognitively comply with these recommendations  Acute kidney injury Hypernatremia Hypokalemia and hypomagnesemia Patient received aggressive repletion of IV fluids during this admission and sodium peaked at 150 on 05/07/2016 and is currently trending down to 147 The creatinine was 1.8 and patient was on CVVHD BUN/creatinine 51/1.8---35/1.5 GFR is improving steadily Continue today dextrose 50 cc per hour IV and reassess with diet subsequently Reassess labs in the morning, potentially discontinue magnesium and potassium supplementation if felt to stabilize by lab work in a.m.  Anemia of critical illness/dilutional anemia Patient hemoglobin 7.4 by MCV 90 The iron level with transient mild thrombocytopenia probably related to sepsis level is 92 and saturation levels are high No further workup Expect that this will resolve  DM with complications of neuropathy Sugars ranging in the 1 hypertension  When able to resume lower dose of lisinopril at maybe 2.5 mg daily will need alternative on discharge given AK I on admission  Medication 130 to 190 range Lantus was discontinued 05/06/2016 secondary to low blood sugar of 50 and patient is only on supplemental coverage at present  Home medications include metformin 1000 twice a day which is on hold as well as glipizide 15 twice a day   Tachycardia NOS Unclear etiology Monitor on tele today  TME Unclear etiology at present No family present and no contact number sin chart to hellp coordinate care at present  Acute protein energy malnutrition Supplement as able   Reflux Continue Pepcid 20 mg twice a day    DVT prophylaxis: Lovenox Code Status: presumed Full Family Communication:  NO family is available to discuss about the patient Disposition Plan: up with PT/Ot  to determine abilities Will need at least 24 hours more on-going  evaluation and monitoring to ensure hemodynamic stability and resolution kidney function Note diet still to be addessed and lab derangements present Might need skilled bnursing care   Consultants:    Procedures:      Objective: Vitals:   05/06/16 2121 05/06/16 2127 05/07/16 0349 05/07/16 0435  BP:  (!) 116/46  (!) 130/47  Pulse:  99  88  Resp:  16  17  Temp:  98.4 F (36.9 C)  98.9 F (37.2 C)  TempSrc:  Oral  Oral  SpO2:  (!) 88%  93%  Weight: 62.1 kg (136 lb 14.4 oz)  63.3 kg (139 lb 8 oz)   Height: 5\' 8"  (1.727 m)       Intake/Output Summary (Last 24 hours) at 05/07/16 0740 Last data filed at 05/07/16 0604  Gross per 24 hour  Intake             1025 ml  Output             1095 ml  Net              -70 ml   Filed Weights   05/06/16 0100 05/06/16 2121 05/07/16 0349  Weight: 58.2 kg (128 lb 4.9 oz) 62.1 kg (136 lb 14.4 oz) 63.3 kg (139 lb 8 oz)    Examination:  General exam: Appears calm and comfortable , L sided IJ is present Respiratory system: Clear to auscultation. Respiratory effort normal. Cardiovascular system: S1 & S2 heard, RRR. No JVD, murmurs, rubs, gallops or clicks. No pedal edema. Gastrointestinal system: Abdomen is nondistended, soft and nontender. No organomegaly or masses felt.  Central nervous system: Alert and oriented. No focal neurological deficits. Extremities: Symmetric 5 x 5 power. Skin: No rashes, lesions or ulcers Psychiatry: Judgement and insight appear normal. Mood & affect appropriate.     Data Reviewed: I have personally reviewed following labs and imaging studies  CBC:  Recent Labs Lab 05/03/16 0830 05/04/16 0315 05/05/16 0422 05/06/16 0355 05/07/16 0515  WBC 22.4* 21.6* 11.3* 10.7* 11.0*  HGB 7.7* 8.3* 7.1* 7.3* 7.4*  HCT 22.9* 24.9* 21.2* 22.3* 22.7*  MCV 87.4 88.6 87.6 87.8 90.1  PLT 112* 129* 134* 174 257   Basic Metabolic Panel:  Recent Labs Lab 05/02/16 1515 05/03/16 0340  05/04/16 0315 05/05/16 0421  05/05/16 1200 05/05/16 1249 05/06/16 0355 05/06/16 1500 05/07/16 0515  NA 135 138  < > 138 140 140 140 150* 148* 147*  K 4.3 4.2  < > 4.2 2.8* 2.9* 2.8* 3.1* 3.0* 3.6  CL 103 104  < > 103 101 104 100* 111 110 110  CO2 27 29  < > 28 27 29 29 28 25 27   GLUCOSE 193* 159*  < > 143* 284* 217* 282* 61* 227* 145*  BUN 13 18  < > 19 52* 55* 51* 48* 44* 35*  CREATININE 1.12 0.95  < > 0.95 1.79* 1.62* 1.80* 1.70* 1.66* 1.57*  CALCIUM 7.6* 7.9*  < > 8.2* 8.4* 8.4* 8.5* 9.2 8.9 9.0  MG 2.3 2.4  --  2.4 1.9  --   --  1.6*  --   --   PHOS 1.9*  1.9* 1.2*  < > 2.2* 3.9  --   --  1.9* 3.7 3.4  < > = values in this interval not displayed. GFR: Estimated Creatinine Clearance: 38.1 mL/min (by C-G formula based on SCr of 1.57 mg/dL (H)). Liver Function  Tests:  Recent Labs Lab 05/03/16 1510 05/04/16 0315 05/05/16 0421 05/06/16 0355 05/07/16 0515  ALBUMIN 2.1* 2.2* 2.0* 2.0* 2.0*   No results for input(s): LIPASE, AMYLASE in the last 168 hours. No results for input(s): AMMONIA in the last 168 hours. Coagulation Profile: No results for input(s): INR, PROTIME in the last 168 hours. Cardiac Enzymes:  Recent Labs Lab 04/30/16 1111 04/30/16 1615 04/30/16 2122  TROPONINI 0.12* 0.41* 0.40*   BNP (last 3 results) No results for input(s): PROBNP in the last 8760 hours. HbA1C: No results for input(s): HGBA1C in the last 72 hours. CBG:  Recent Labs Lab 05/06/16 1957 05/06/16 2136 05/07/16 0004 05/07/16 0334 05/07/16 0431  GLUCAP 241* 221* 169* 140* 137*   Lipid Profile: No results for input(s): CHOL, HDL, LDLCALC, TRIG, CHOLHDL, LDLDIRECT in the last 72 hours. Thyroid Function Tests: No results for input(s): TSH, T4TOTAL, FREET4, T3FREE, THYROIDAB in the last 72 hours. Anemia Panel: No results for input(s): VITAMINB12, FOLATE, FERRITIN, TIBC, IRON, RETICCTPCT in the last 72 hours. Urine analysis:    Component Value Date/Time   COLORURINE YELLOW 04/30/2016 0541   APPEARANCEUR  CLOUDY (A) 04/30/2016 0541   LABSPEC 1.018 04/30/2016 0541   PHURINE 5.0 04/30/2016 0541   GLUCOSEU 100 (A) 04/30/2016 0541   HGBUR MODERATE (A) 04/30/2016 0541   BILIRUBINUR SMALL (A) 04/30/2016 0541   KETONESUR NEGATIVE 04/30/2016 0541   PROTEINUR NEGATIVE 04/30/2016 0541   NITRITE NEGATIVE 04/30/2016 0541   LEUKOCYTESUR NEGATIVE 04/30/2016 0541   Sepsis Labs: @LABRCNTIP (procalcitonin:4,lacticidven:4)  ) Recent Results (from the past 240 hour(s))  MRSA PCR Screening     Status: None   Collection Time: 04/30/16  3:15 AM  Result Value Ref Range Status   MRSA by PCR NEGATIVE NEGATIVE Final    Comment:        The GeneXpert MRSA Assay (FDA approved for NASAL specimens only), is one component of a comprehensive MRSA colonization surveillance program. It is not intended to diagnose MRSA infection nor to guide or monitor treatment for MRSA infections.   Culture, Urine     Status: None   Collection Time: 04/30/16  5:41 AM  Result Value Ref Range Status   Specimen Description URINE, RANDOM  Final   Special Requests NONE  Final   Culture NO GROWTH  Final   Report Status 05/01/2016 FINAL  Final  Culture, blood (Routine X 2) w Reflex to ID Panel     Status: None   Collection Time: 04/30/16  7:27 AM  Result Value Ref Range Status   Specimen Description BLOOD LEFT ARM  Final   Special Requests BOTTLES DRAWN AEROBIC AND ANAEROBIC 6CC EACH  Final   Culture NO GROWTH 5 DAYS  Final   Report Status 05/05/2016 FINAL  Final  Culture, blood (Routine X 2) w Reflex to ID Panel     Status: None   Collection Time: 04/30/16  7:27 AM  Result Value Ref Range Status   Specimen Description BLOOD LEFT HAND  Final   Special Requests BOTTLES DRAWN AEROBIC ONLY 6CC  Final   Culture NO GROWTH 5 DAYS  Final   Report Status 05/05/2016 FINAL  Final  Culture, respiratory (NON-Expectorated)     Status: None   Collection Time: 05/03/16  1:58 PM  Result Value Ref Range Status   Specimen Description  TRACHEAL ASPIRATE  Final   Special Requests Normal  Final   Gram Stain   Final    MODERATE WBC PRESENT, PREDOMINANTLY PMN FEW GRAM NEGATIVE  RODS RARE GRAM POSITIVE COCCI IN PAIRS RARE GRAM NEGATIVE COCCOBACILLI    Culture   Final    RARE KLEBSIELLA PNEUMONIAE RARE SERRATIA MARCESCENS    Report Status 05/06/2016 FINAL  Final   Organism ID, Bacteria KLEBSIELLA PNEUMONIAE  Final   Organism ID, Bacteria SERRATIA MARCESCENS  Final      Susceptibility   Klebsiella pneumoniae - MIC*    AMPICILLIN >=32 RESISTANT Resistant     CEFAZOLIN <=4 SENSITIVE Sensitive     CEFEPIME <=1 SENSITIVE Sensitive     CEFTAZIDIME <=1 SENSITIVE Sensitive     CEFTRIAXONE <=1 SENSITIVE Sensitive     CIPROFLOXACIN <=0.25 SENSITIVE Sensitive     GENTAMICIN <=1 SENSITIVE Sensitive     IMIPENEM <=0.25 SENSITIVE Sensitive     TRIMETH/SULFA <=20 SENSITIVE Sensitive     AMPICILLIN/SULBACTAM 4 SENSITIVE Sensitive     PIP/TAZO <=4 SENSITIVE Sensitive     Extended ESBL NEGATIVE Sensitive     * RARE KLEBSIELLA PNEUMONIAE   Serratia marcescens - MIC*    CEFAZOLIN >=64 RESISTANT Resistant     CEFEPIME <=1 SENSITIVE Sensitive     CEFTAZIDIME <=1 SENSITIVE Sensitive     CEFTRIAXONE <=1 SENSITIVE Sensitive     CIPROFLOXACIN <=0.25 SENSITIVE Sensitive     GENTAMICIN <=1 SENSITIVE Sensitive     TRIMETH/SULFA <=20 SENSITIVE Sensitive     * RARE SERRATIA MARCESCENS         Radiology Studies: Dg Chest Port 1 View  Result Date: 05/05/2016 CLINICAL DATA:  Acute respiratory failure. EXAM: PORTABLE CHEST 1 VIEW COMPARISON:  05/03/2016. FINDINGS: 1104 hours. Endotracheal tube tip is 4.2 cm above the base the carina. Left IJ central line tip overlies the mid to distal SVC level. The NG tube passes into the stomach although the distal tip position is not included on the film. Patchy airspace disease in the left lung base persists but appears slightly improved in the interval. No focal airspace disease in the right lung. The  cardiopericardial silhouette is within normal limits for size. The visualized bony structures of the thorax are intact. Telemetry leads overlie the chest. IMPRESSION: Slight interval improvement in left basilar aeration. Electronically Signed   By: Kennith Center M.D.   On: 05/05/2016 11:21        Scheduled Meds: . chlorhexidine  15 mL Mouth Rinse BID  . famotidine  20 mg Oral BID  . insulin aspart  0-9 Units Subcutaneous Q4H  . ipratropium-albuterol  3 mL Nebulization QID  . magnesium oxide  400 mg Oral BID  . mouth rinse  15 mL Mouth Rinse q12n4p   Continuous Infusions: . dextrose 5 % and 0.45% NaCl 50 mL/hr at 05/07/16 0352     LOS: 7 days    Time spent: 29    Pleas Koch, MD [facility] ([facility]   If 7PM-7AM, please contact night-coverage [email or web address] Password [email or web address] 05/07/2016, 7:40 AM

## 2016-05-07 NOTE — Progress Notes (Addendum)
LM with [facility] for assistance with verifying VA ins, and PCP. Awaiting call back at this time. [phone] ext [phone] Per Victorino DikeJennifer patient's [facility]. She could not give any further information. CM spoke with rep at [phone] and obtained SS#. Will include in HAR note for financial services to obtain Medicare information.

## 2016-05-07 NOTE — Evaluation (Signed)
Physical Therapy Evaluation Patient Details Name: Janece CanterburyDonnie Shatswell MRN: [id number] DOB: November 04, 1940 Today's Date: 05/07/2016   History of Present Illness  pt is a 75 y/o male with pmh of DM, HTN, found unresponsive outside by neighbors.  Found to have PNA and sepsis and [facility].  Clinical Impression  Pt admitted with/for PNA/sepsis.  Pt currently limited functionally due to the problems listed. ( See problems list.)   Pt will benefit from PT to maximize function and safety in order to get ready for next venue listed below.  Pt's at mod assist level for basic mobility, but pt unable to participate cognitively with multimodal cues.     Follow Up Recommendations SNF    Equipment Recommendations   (TBD)    Recommendations for Other Services       Precautions / Restrictions Precautions Precautions: Fall      Mobility  Bed Mobility Overal bed mobility: Needs Assistance Bed Mobility: Sidelying to Sit;Sit to Sidelying   Sidelying to sit: Mod assist     Sit to sidelying: Mod assist General bed mobility comments: multimodal cues for direction, redirection to task and physical assist to move through task  Transfers Overall transfer level: Needs assistance   Transfers: Sit to/from Stand Sit to Stand: Mod assist         General transfer comment: assist to come forward and boost up.  Ambulation/Gait Ambulation/Gait assistance: Mod assist Ambulation Distance (Feet): 7 Feet (foward and back) Assistive device: 1 person hand held assist Gait Pattern/deviations: Step-through pattern   Gait velocity interpretation: Below normal speed for age/gender General Gait Details: unsteady overall with need for stability throughout.  Stairs            Wheelchair Mobility    Modified Rankin (Stroke Patients Only)       Balance Overall balance assessment: Needs assistance Sitting-balance support: No upper extremity supported Sitting balance-Leahy Scale:  Fair       Standing balance-Leahy Scale: Poor Standing balance comment: reliant on assist                             Pertinent Vitals/Pain Pain Assessment: Faces Faces Pain Scale: No hurt    Home Living Family/patient expects to be discharged to:: Private residence Living Arrangements: Alone               Additional Comments: No family available to ask info questions    Prior Function           Comments: Unsure of PLOF     Hand Dominance        Extremity/Trunk Assessment   Upper Extremity Assessment: Defer to OT evaluation           Lower Extremity Assessment: Generalized weakness         Communication   Communication: Other (comment) (Communication difficulties for unknown reason suspect enceph)  Cognition Arousal/Alertness: Awake/alert Behavior During Therapy: Flat affect Overall Cognitive Status: No family/caregiver present to determine baseline cognitive functioning (likely impaired from baseline)                      General Comments      Exercises        Assessment/Plan    PT Assessment Patient needs continued PT services  PT Diagnosis Difficulty walking;Generalized weakness   PT Problem List Decreased strength;Decreased activity tolerance;Decreased balance;Decreased mobility;Decreased knowledge of use of DME;Decreased safety awareness;Decreased knowledge of precautions;Decreased  cognition;Decreased coordination  PT Treatment Interventions Gait training;DME instruction;Functional mobility training;Therapeutic activities;Therapeutic exercise;Balance training;Patient/family education   PT Goals (Current goals can be found in the Care Plan section) Acute Rehab PT Goals Patient Stated Goal: pt unable PT Goal Formulation: Patient unable to participate in goal setting Time For Goal Achievement: 05/21/16 Potential to Achieve Goals: Good    Frequency Min 3X/week   Barriers to discharge        Co-evaluation                End of Session   Activity Tolerance: Other (comment) (limited by cognition, weakness and fatigue) Patient left: in bed;with call bell/phone within reach;with bed alarm set Nurse Communication: Mobility status         Time: 1810-1839 PT Time Calculation (min) (ACUTE ONLY): 29 min   Charges:   PT Evaluation $PT Eval Moderate Complexity: 1 Procedure PT Treatments $Therapeutic Activity: 8-22 mins   PT G Codes:        Lindaann Gradilla, Eliseo Gum 05/07/2016, 7:01 PM 05/07/2016  [place] Bing, PT [phone]  (pager)

## 2016-05-07 NOTE — Progress Notes (Signed)
This CSW staffed case with unit CSW. This CSW signing off.          Lance MussAshley Gardner,MSW, LCSW [facility]/11M Clinical Social Worker [phone]

## 2016-05-07 NOTE — Progress Notes (Signed)
ANTICOAGULATION CONSULT NOTE - Initial Consult  Pharmacy Consult for Lovenox Indication: VTE prophylaxis  No Known Allergies  Patient Measurements: Height: 5\' 8"  (172.7 cm) Weight: 139 lb 8 oz (63.3 kg) IBW/kg (Calculated) : 68.4  Vital Signs: Temp: 98.9 F (37.2 C) (09/13 0435) Temp Source: Oral (09/13 0435) BP: 130/47 (09/13 0435) Pulse Rate: 88 (09/13 0435)  Labs:  Recent Labs  05/05/16 0422  05/06/16 0355 05/06/16 1500 05/07/16 0515  HGB 7.1*  --  7.3*  --  7.4*  HCT 21.2*  --  22.3*  --  22.7*  PLT 134*  --  174  --  257  CREATININE  --   < > 1.70* 1.66* 1.57*  < > = values in this interval not displayed.  Estimated Creatinine Clearance: 38.1 mL/min (by C-G formula based on SCr of 1.57 mg/dL (H)).   Medical History: History reviewed. No pertinent past medical history.  Medications:  See EMR  Assessment: [age] YOM admitted with ARF secondary to sepsis - required CVVHD between 04/30/16 and 05/04/16. Pharmacy to dose lovenox for VTE prophylaxis. SCr improving although unsure of baseline. CrCl ~ 40 mL/min. H/H low stable - 7.4/22.7. Plt WNL.   Plan: -Lovenox 40mg  q24h -Monitor CBC, SCr, s/sx bleeding -May require change to SQ heparin if renal function again worsens  Sherle Poeob Amberlie Gaillard, PharmD Clinical Pharmacist 11:31 AM, 05/07/2016

## 2016-05-07 NOTE — Progress Notes (Signed)
Speech Language Pathology Treatment: Dysphagia  Patient Details Name: Ross Daniel MRN: [id number] DOB: 18-Dec-1940 Today's Date: 05/07/2016 Time: [id number] SLP Time Calculation (min) (ACUTE ONLY): 11 min  Assessment / Plan / Recommendation Clinical Impression  F/u after yesterday's swallow evaluation.  Pt with persisting confusion, intermittent impulsivity, indifference, inconsistent responsiveness to questions.  Consumed large boluses of nectar liquids, applesauce with no overt s/s of aspiration.  Thin liquids remain mildly concerning for potential aspiration.  Lungs are clear, diminished.  Suspect rapid resolution of any swallowing deficits and progression of diet. For today, continue dysphagia 1, nectars.  Will f/u next date.     HPI HPI: 75 year old with diabetes, hypertension, admitted with severe sepsis, multilobar pna, possible aspiration. He was found unresponsive in his underwear outside his house by neighbors. [facility].  Intubated 9/6-11.      SLP Plan  Continue with current plan of care     Recommendations  Diet recommendations: Dysphagia 1 (puree);Nectar-thick liquid Liquids provided via: Cup Medication Administration: Whole meds with puree Supervision: Patient able to self feed;Intermittent supervision to cue for compensatory strategies Compensations: Slow rate;Small sips/bites Postural Changes and/or Swallow Maneuvers: Seated upright 90 degrees             Oral Care Recommendations: Oral care BID Follow up Recommendations: None Plan: Continue with current plan of care     GO                Blenda MountsCouture, Ross Daniel 05/07/2016, 11:54 AM

## 2016-05-08 DIAGNOSIS — J690 Pneumonitis due to inhalation of food and vomit: Secondary | ICD-10-CM

## 2016-05-08 LAB — CBC WITH DIFFERENTIAL/PLATELET
BASOS ABS: 0 10*3/uL (ref 0.0–0.1)
Basophils Relative: 0 %
EOS PCT: 1 %
Eosinophils Absolute: 0.1 10*3/uL (ref 0.0–0.7)
HCT: 21.5 % — ABNORMAL LOW (ref 39.0–52.0)
Hemoglobin: 6.8 g/dL — CL (ref 13.0–17.0)
LYMPHS PCT: 7 %
Lymphs Abs: 1 10*3/uL (ref 0.7–4.0)
MCH: 29.1 pg (ref 26.0–34.0)
MCHC: 31.6 g/dL (ref 30.0–36.0)
MCV: 91.9 fL (ref 78.0–100.0)
MONO ABS: 0.8 10*3/uL (ref 0.1–1.0)
Monocytes Relative: 5 %
Neutro Abs: 12.4 10*3/uL — ABNORMAL HIGH (ref 1.7–7.7)
Neutrophils Relative %: 87 %
PLATELETS: 320 10*3/uL (ref 150–400)
RBC: 2.34 MIL/uL — AB (ref 4.22–5.81)
RDW: 14.3 % (ref 11.5–15.5)
WBC: 14.3 10*3/uL — AB (ref 4.0–10.5)

## 2016-05-08 LAB — HEMOGLOBIN AND HEMATOCRIT, BLOOD
HEMATOCRIT: 27.7 % — AB (ref 39.0–52.0)
Hemoglobin: 9.3 g/dL — ABNORMAL LOW (ref 13.0–17.0)

## 2016-05-08 LAB — MAGNESIUM: MAGNESIUM: 1.7 mg/dL (ref 1.7–2.4)

## 2016-05-08 LAB — COMPREHENSIVE METABOLIC PANEL
ALBUMIN: 2 g/dL — AB (ref 3.5–5.0)
ALT: 20 U/L (ref 17–63)
ANION GAP: 6 (ref 5–15)
AST: 21 U/L (ref 15–41)
Alkaline Phosphatase: 57 U/L (ref 38–126)
BILIRUBIN TOTAL: 0.8 mg/dL (ref 0.3–1.2)
BUN: 26 mg/dL — ABNORMAL HIGH (ref 6–20)
CO2: 28 mmol/L (ref 22–32)
Calcium: 8.8 mg/dL — ABNORMAL LOW (ref 8.9–10.3)
Chloride: 112 mmol/L — ABNORMAL HIGH (ref 101–111)
Creatinine, Ser: 1.51 mg/dL — ABNORMAL HIGH (ref 0.61–1.24)
GFR, EST AFRICAN AMERICAN: 51 mL/min — AB (ref >60)
GFR, EST NON AFRICAN AMERICAN: 44 mL/min — AB (ref >60)
Glucose, Bld: 173 mg/dL — ABNORMAL HIGH (ref 65–99)
POTASSIUM: 3.8 mmol/L (ref 3.5–5.1)
Sodium: 146 mmol/L — ABNORMAL HIGH (ref 135–145)
TOTAL PROTEIN: 5 g/dL — AB (ref 6.5–8.1)

## 2016-05-08 LAB — IRON AND TIBC
IRON: 13 ug/dL — AB (ref 45–182)
Saturation Ratios: 7 % — ABNORMAL LOW (ref 17.9–39.5)
TIBC: 190 ug/dL — AB (ref 250–450)
UIBC: 177 ug/dL

## 2016-05-08 LAB — GLUCOSE, CAPILLARY
GLUCOSE-CAPILLARY: 151 mg/dL — AB (ref 65–99)
GLUCOSE-CAPILLARY: 293 mg/dL — AB (ref 65–99)
Glucose-Capillary: 197 mg/dL — ABNORMAL HIGH (ref 65–99)
Glucose-Capillary: 199 mg/dL — ABNORMAL HIGH (ref 65–99)
Glucose-Capillary: 233 mg/dL — ABNORMAL HIGH (ref 65–99)
Glucose-Capillary: 99 mg/dL (ref 65–99)

## 2016-05-08 LAB — FERRITIN: Ferritin: 207 ng/mL (ref 24–336)

## 2016-05-08 LAB — PREPARE RBC (CROSSMATCH)

## 2016-05-08 LAB — ABO/RH: ABO/RH(D): A POS

## 2016-05-08 MED ORDER — SODIUM CHLORIDE 0.9 % IV SOLN
Freq: Once | INTRAVENOUS | Status: AC
  Administered 2016-05-08: 250 mL via INTRAVENOUS

## 2016-05-08 MED ORDER — DARBEPOETIN ALFA 60 MCG/0.3ML IJ SOSY
60.0000 ug | PREFILLED_SYRINGE | INTRAMUSCULAR | Status: DC
  Administered 2016-05-10: 60 ug via SUBCUTANEOUS
  Filled 2016-05-08: qty 0.3

## 2016-05-08 MED ORDER — FUROSEMIDE 10 MG/ML IJ SOLN
20.0000 mg | Freq: Once | INTRAMUSCULAR | Status: AC
  Administered 2016-05-08: 20 mg via INTRAVENOUS
  Filled 2016-05-08: qty 2

## 2016-05-08 MED ORDER — DIPHENHYDRAMINE HCL 25 MG PO CAPS
25.0000 mg | ORAL_CAPSULE | Freq: Once | ORAL | Status: AC
  Administered 2016-05-08: 25 mg via ORAL
  Filled 2016-05-08: qty 1

## 2016-05-08 MED ORDER — FREE WATER
200.0000 mL | Freq: Three times a day (TID) | Status: DC
  Administered 2016-05-08 – 2016-05-10 (×5): 200 mL via ORAL

## 2016-05-08 MED ORDER — INSULIN DETEMIR 100 UNIT/ML ~~LOC~~ SOLN
4.0000 [IU] | Freq: Every day | SUBCUTANEOUS | Status: DC
  Administered 2016-05-08 – 2016-05-10 (×3): 4 [IU] via SUBCUTANEOUS
  Filled 2016-05-08 (×3): qty 0.04

## 2016-05-08 MED ORDER — ALBUTEROL SULFATE (2.5 MG/3ML) 0.083% IN NEBU: 2.5000 mg | INHALATION_SOLUTION | RESPIRATORY_TRACT | Status: DC | PRN

## 2016-05-08 NOTE — Progress Notes (Signed)
Nutrition Follow-up  DOCUMENTATION CODES:   Not applicable  INTERVENTION:   -Magic Cup TID with meals  NUTRITION DIAGNOSIS:   Predicted suboptimal nutrient intake related to dysphagia, lethargy/confusion as evidenced by estimated needs.  Ongoing  GOAL:   Patient will meet greater than or equal to 90% of their needs  Progressing  MONITOR:   PO intake, Supplement acceptance, Diet advancement, Labs, Skin, I & O's  REASON FOR ASSESSMENT:   Consult Enteral/tube feeding initiation and management  ASSESSMENT:   75 year old with possible history of diabetes, hypertension. He was found unresponsive in his underwear outside his house by neightbours. [facility] was [facility] where he was found to have pneumonia, sepsis with severe anion metabolic acidosis, lactic acidosis, AKI (labs below). [facility].   Pt transferred from ICU to medical floor on 05/06/16.   SLP evaluated pt on 05/06/16; pt was advanced to a dysphagia 1 diet with nectar thick liquids. SLP continues to follow for diet tolerance and potential advancement.   Spoke with pt at bedside. He remains confused. Answered all questions with either "yeah" or "I'm doing alright". Pt holding a cup of water at time of visit.   Case discussed with RN, who reports no observed difficulty with foods, liquids, and medications.   Labs reviewed.   Diet Order:  DIET - DYS 1 Room service appropriate? Yes; Fluid consistency: Nectar Thick  Skin:  Reviewed, no issues  Last BM:  05/08/16  Height:   Ht Readings from Last 1 Encounters:  05/06/16 5\' 8"  (1.727 m)    Weight:   Wt Readings from Last 1 Encounters:  05/08/16 134 lb 6.4 oz (61 kg)    Ideal Body Weight:  67.3 kg  BMI:  Body mass index is 20.44 kg/m.  Estimated Nutritional Needs:   Kcal:  1500-1700  Protein:  70-90 grams  Fluid:  1.5-1.7 L/day  EDUCATION NEEDS:   No education needs identified at this time  Yaneliz Radebaugh  A. Mayford KnifeWilliams, RD, LDN, CDE Pager: [phone] After hours Pager: [phone]

## 2016-05-08 NOTE — Progress Notes (Signed)
CSW working to find placement for patient- per financial counseling patient does have Medicare benefits and would qualify for SNF through Medicare- patient [place].   CSW spoke with pt friend Mr. Mayford Knifeurner who is agreeable to signing paperwork for patient at SNF for rehab stay- referral sent out to [place], [place] SNFs  CSW will continue to follow  Burna SisJenna H. Gailyn Crook, [facility] Clinical Social Worker [phone]

## 2016-05-08 NOTE — Progress Notes (Signed)
PROGRESS NOTE    Ross Daniel  ZOX:[id number] DOB: [birth year] DOA: 04/30/2016 PCP: No primary care provider on file.  Outpatient Specialists:     Brief Narrative:  75 year old male with known history of diabetes mellitus since 2009  Found unresponsive 04/30/2016 outside the house Admitted to [facility] emergency room with pneumonia and severe sepsis severe anion gap metabolic acidosis and acute kidney injury Initial bicarbonate 3, potassium 8  Blood pressure Anion gap 31 WBC 28 PH 7.046 BUN/creatinine 190/13.4 transferred to [facility] critical care service. Sputum cultures grew Klebsiella and Serratia patient initially started on broad spectrum vancomycin/Zosyn/azithromycin and  9/8 to Levaquin compelting on 05/06/16 Initially intubated and because of acute kidney injury kept on CVVHD and these were subsequently discontinued on 05/02/16 Nephrology is still following  transferred to hospitalist service 05/07/2016   SuBj Disoriented still Some echolalia No overt complaint Received call from nurse Re: hemoglobin 6 range No overt bleeding  Assessment & Plan:   Active Problems:   AKI (acute kidney injury) (HCC)   Acute respiratory failure (HCC)   Respiratory compromise   Pneumonia   Pneumonia due to Klebsiella pneumoniae (HCC)  Left lower lobe pneumonia-initially was on broad-spectrum antibiotics and cultures grew out Klebsiella and Serratia completed by mouth Levaquin subsequently as well on 05/06/16 WBC 11 range however no overt evidence is of high fever chills clinically appearing to improve without high-grade fevers The patient was intubated and on pressors until 05/05/2016 from outside hospital This has clinically rewsolved-patient no longer coughing or in distress  Demand ischemia Troponin peaked at 0.40 on 9/6 and no further workup was initiated  Dysphagia Speech therapy saw the patient 05/06/2016 recommending home meds with  pure some mild cognitive deficits  and follow-up speech evaluation Also recommending thickener and dysphagia 1 diet + nectar thick liquids Unclear if patient can cognitively comply recommendations  Acute kidney injury Hypernatremia Hypokalemia and hypomagnesemia Patient received aggressive repletion of IV fluids during this admission and sodium peaked at 150 on 05/07/2016 and is currently trending down to 147 The creatinine was 1.8 and patient was on CVVHD BUN/creatinine 51/1.8---35/1.5 GFR is improving steadily Did d/c 50 cc IV  9/14--force free water Reassess labs in the morning, potentially discontinue magnesium and potassium supplementation if felt to stabilize by lab work in a.m.  Anemia of critical illness/dilutional anemia Patient hemoglobin 7.4 by MCV 90 The iron level with transient mild thrombocytopenia probably related to sepsis level is 92 and saturation levels are high No further workup Expect that this will resolve  DM with complications of neuropathy Sugars ranging in the 170 to 240 range Lantus was discontinued 05/06/2016 restarted 9/14 lower dose 4 u  Home medications include metformin 1000 twice a day which is on hold as well as glipizide 15 twice a day   Tachycardia NOS Unclear etiology Monitor on tele today  TME Unclear etiology at present Get RPR/b12/folate/ammonia No family present and no contact number sin chart to hellp coordinate care at present 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 prior to this illness and hospital stay  Acute protein energy malnutrition Supplement as able   Reflux Continue Pepcid 20 mg twice a day    DVT prophylaxis: Lovenox Code Status: presumed Full Family Communication:  NO family is available to discuss about the patient Disposition Plan: up with PT/Ot to determine abilities Kidney function improving, Expect to d/c to skilled care in [place] nursing care   Consultants:  Procedures:       Objective: Vitals:   05/08/16 0632 05/08/16 0633 05/08/16 1151 05/08/16 1208  BP: (!) 122/47  (!) 126/47 (!) 124/50  Pulse: 91  93 90  Resp: 18  18 18   Temp: 98.5 F (36.9 C)  98.6 F (37 C) 98.2 F (36.8 C)  TempSrc: Oral  Axillary Axillary  SpO2: 93%  91% 92%  Weight:  61 kg (134 lb 6.4 oz)    Height:        Intake/Output Summary (Last 24 hours) at 05/08/16 1251 Last data filed at 05/08/16 1153  Gross per 24 hour  Intake              510 ml  Output              800 ml  Net             -290 ml   Filed Weights   05/06/16 2121 05/07/16 0349 05/08/16 0633  Weight: 62.1 kg (136 lb 14.4 oz) 63.3 kg (139 lb 8 oz) 61 kg (134 lb 6.4 oz)    Examination:  General exam: Appears calm and comfortable, confused still , L sided IJ is present Respiratory system: Clear to auscultation. Respiratory effort normal. Cardiovascular system: S1 & S2 heard, RRR. No JVD, murmurs, No pedal edema. Gastrointestinal system: Abdomen is nondistended, soft and nontender. No organomegaly or masses felt.  Central nervous system: Alert and oriented. No neurological deficits. Extremities: Symmetric 5 x 5 power. Skin: No rashes Psychiatry: Judgement and insight appear normal. Mood & affect appropriate.     Data Reviewed: I have personally reviewed following labs and imaging studies  CBC:  Recent Labs Lab 05/04/16 0315 05/05/16 0422 05/06/16 0355 05/07/16 0515 05/08/16 0706  WBC 21.6* 11.3* 10.7* 11.0* 14.3*  NEUTROABS  --   --   --   --  12.4*  HGB 8.3* 7.1* 7.3* 7.4* 6.8*  HCT 24.9* 21.2* 22.3* 22.7* 21.5*  MCV 88.6 87.6 87.8 90.1 91.9  PLT 129* 134* 174 257 320   Basic Metabolic Panel:  Recent Labs Lab 05/03/16 0340  05/04/16 0315 05/05/16 0421  05/05/16 1249 05/06/16 0355 05/06/16 1500 05/07/16 0515 05/08/16 0706  NA 138  < > 138 140  < > 140 150* 148* 147* 146*  K 4.2  < > 4.2 2.8*  < > 2.8* 3.1* 3.0* 3.6 3.8  CL 104  < > 103 101  < > 100* 111 110 110 112*  CO2 29   < > 28 27  < > 29 28 25 27 28   GLUCOSE 159*  < > 143* 284*  < > 282* 61* 227* 145* 173*  BUN 18  < > 19 52*  < > 51* 48* 44* 35* 26*  CREATININE 0.95  < > 0.95 1.79*  < > 1.80* 1.70* 1.66* 1.57* 1.51*  CALCIUM 7.9*  < > 8.2* 8.4*  < > 8.5* 9.2 8.9 9.0 8.8*  MG 2.4  --  2.4 1.9  --   --  1.6*  --   --  1.7  PHOS 1.2*  < > 2.2* 3.9  --   --  1.9* 3.7 3.4  --   < > = values in this interval not displayed. GFR: Estimated Creatinine Clearance: 38.2 mL/min (by C-G formula based on SCr of 1.51 mg/dL (H)). Liver Function Tests:  Recent Labs Lab 05/04/16 0315 05/05/16 0421 05/06/16 0355 05/07/16 0515 05/08/16 0706  AST  --   --   --   --  21  ALT  --   --   --   --  20  ALKPHOS  --   --   --   --  57  BILITOT  --   --   --   --  0.8  PROT  --   --   --   --  5.0*  ALBUMIN 2.2* 2.0* 2.0* 2.0* 2.0*   No results for input(s): LIPASE, AMYLASE in the last 168 hours. No results for input(s): AMMONIA in the last 168 hours. Coagulation Profile: No results for input(s): INR, PROTIME in the last 168 hours. Cardiac Enzymes: No results for input(s): CKTOTAL, CKMB, CKMBINDEX, TROPONINI in the last 168 hours. BNP (last 3 results) No results for input(s): PROBNP in the last 8760 hours. HbA1C: No results for input(s): HGBA1C in the last 72 hours. CBG:  Recent Labs Lab 05/07/16 2019 05/08/16 0017 05/08/16 0321 05/08/16 0749 05/08/16 1202  GLUCAP 236* 197* 199* 151* 293*   Lipid Profile: No results for input(s): CHOL, HDL, LDLCALC, TRIG, CHOLHDL, LDLDIRECT in the last 72 hours. Thyroid Function Tests: No results for input(s): TSH, T4TOTAL, FREET4, T3FREE, THYROIDAB in the last 72 hours. Anemia Panel:  Recent Labs  05/08/16 1005  FERRITIN 207  TIBC 190*  IRON 13*   Urine analysis:    Component Value Date/Time   COLORURINE YELLOW 04/30/2016 0541   APPEARANCEUR CLOUDY (A) 04/30/2016 0541   LABSPEC 1.018 04/30/2016 0541   PHURINE 5.0 04/30/2016 0541   GLUCOSEU 100 (A) 04/30/2016  0541   HGBUR MODERATE (A) 04/30/2016 0541   BILIRUBINUR SMALL (A) 04/30/2016 0541   KETONESUR NEGATIVE 04/30/2016 0541   PROTEINUR NEGATIVE 04/30/2016 0541   NITRITE NEGATIVE 04/30/2016 0541   LEUKOCYTESUR NEGATIVE 04/30/2016 0541   Recent Results (from the past 240 hour(s))  MRSA PCR Screening     Status: None   Collection Time: 04/30/16  3:15 AM  Result Value Ref Range Status   MRSA by PCR NEGATIVE NEGATIVE Final    Comment:        The GeneXpert MRSA Assay (FDA approved for NASAL specimens only), is one component of a comprehensive MRSA colonization surveillance program. It is not intended to diagnose MRSA infection nor to guide or monitor treatment for MRSA infections.   Culture, Urine     Status: None   Collection Time: 04/30/16  5:41 AM  Result Value Ref Range Status   Specimen Description URINE, RANDOM  Final   Special Requests NONE  Final   Culture NO GROWTH  Final   Report Status 05/01/2016 FINAL  Final  Culture, blood (Routine X 2) w Reflex to ID Panel     Status: None   Collection Time: 04/30/16  7:27 AM  Result Value Ref Range Status   Specimen Description BLOOD LEFT ARM  Final   Special Requests BOTTLES DRAWN AEROBIC AND ANAEROBIC 6CC EACH  Final   Culture NO GROWTH 5 DAYS  Final   Report Status 05/05/2016 FINAL  Final  Culture, blood (Routine X 2) w Reflex to ID Panel     Status: None   Collection Time: 04/30/16  7:27 AM  Result Value Ref Range Status   Specimen Description BLOOD LEFT HAND  Final   Special Requests BOTTLES DRAWN AEROBIC ONLY 6CC  Final   Culture NO GROWTH 5 DAYS  Final   Report Status 05/05/2016 FINAL  Final  Culture, respiratory (NON-Expectorated)     Status: None   Collection Time: 05/03/16  1:58 PM  Result Value Ref Range Status   Specimen Description TRACHEAL ASPIRATE  Final   Special Requests Normal  Final   Gram Stain   Final    MODERATE WBC PRESENT, PREDOMINANTLY PMN FEW GRAM NEGATIVE RODS RARE GRAM POSITIVE COCCI IN  PAIRS RARE GRAM NEGATIVE COCCOBACILLI    Culture   Final    RARE KLEBSIELLA PNEUMONIAE RARE SERRATIA MARCESCENS    Report Status 05/06/2016 FINAL  Final   Organism ID, Bacteria KLEBSIELLA PNEUMONIAE  Final   Organism ID, Bacteria SERRATIA MARCESCENS  Final      Susceptibility   Klebsiella pneumoniae - MIC*    AMPICILLIN >=32 RESISTANT Resistant     CEFAZOLIN <=4 SENSITIVE Sensitive     CEFEPIME <=1 SENSITIVE Sensitive     CEFTAZIDIME <=1 SENSITIVE Sensitive     CEFTRIAXONE <=1 SENSITIVE Sensitive     CIPROFLOXACIN <=0.25 SENSITIVE Sensitive     GENTAMICIN <=1 SENSITIVE Sensitive     IMIPENEM <=0.25 SENSITIVE Sensitive     TRIMETH/SULFA <=20 SENSITIVE Sensitive     AMPICILLIN/SULBACTAM 4 SENSITIVE Sensitive     PIP/TAZO <=4 SENSITIVE Sensitive     Extended ESBL NEGATIVE Sensitive     * RARE KLEBSIELLA PNEUMONIAE   Serratia marcescens - MIC*    CEFAZOLIN >=64 RESISTANT Resistant     CEFEPIME <=1 SENSITIVE Sensitive     CEFTAZIDIME <=1 SENSITIVE Sensitive     CEFTRIAXONE <=1 SENSITIVE Sensitive     CIPROFLOXACIN <=0.25 SENSITIVE Sensitive     GENTAMICIN <=1 SENSITIVE Sensitive     TRIMETH/SULFA <=20 SENSITIVE Sensitive     * RARE SERRATIA MARCESCENS         Radiology Studies: No results found.      Scheduled Meds: . chlorhexidine  15 mL Mouth Rinse BID  . [START ON 05/10/2016] darbepoetin (ARANESP) injection - NON-DIALYSIS  60 mcg Subcutaneous Q Sat-1800  . enoxaparin (LOVENOX) injection  40 mg Subcutaneous Q24H  . famotidine  20 mg Oral BID  . insulin aspart  0-9 Units Subcutaneous Q4H  . magnesium oxide  400 mg Oral BID  . mouth rinse  15 mL Mouth Rinse q12n4p   Continuous Infusions:     LOS: 8 days    Time spent: 4540  Pleas KochJai Ozzie Remmers, MD [facility] ([facility]) [phone]  If 7PM-7AM, please contact night-coverage [email or web address] Password [email or web address] 05/08/2016, 12:51 PM

## 2016-05-08 NOTE — NC FL2 (Signed)
[place] MEDICAID FL2 LEVEL OF CARE SCREENING TOOL     IDENTIFICATION  Patient Name: Janece CanterburyDonnie Dipinto Birthdate: [birth year] Sex: male Admission Date (Current Location): 04/30/2016  [place]:   Octavio Manns([place], [place]   Facility and Address:  [facility] [place], [address], [place], [place] [address]      Provider Number: [id number]  Attending Physician Name and Address:  Rhetta MuraJai-Gurmukh Samtani, MD  Relative Name and Phone Number:       Current Level of Care: Hospital Recommended Level of Care: Skilled Nursing Facility Prior Approval Number:    Date Approved/Denied:   PASRR Number:    Discharge Plan: SNF    Current Diagnoses: Patient Active Problem List   Diagnosis Date Noted  . Pneumonia due to Klebsiella pneumoniae (HCC)   . Pneumonia   . Respiratory compromise   . AKI (acute kidney injury) (HCC) 04/30/2016  . Acute respiratory failure (HCC)     Orientation RESPIRATION BLADDER Height & Weight     Self  O2 (2L [place]) Indwelling catheter Weight: 61 kg (134 lb 6.4 oz) Height:  5\' 8"  (172.7 cm)  BEHAVIORAL SYMPTOMS/MOOD NEUROLOGICAL BOWEL NUTRITION STATUS      Incontinent (rectal tube) Diet (regular)  AMBULATORY STATUS COMMUNICATION OF NEEDS Skin   Extensive Assist Verbally Normal                       Personal Care Assistance Level of Assistance  Bathing, Dressing Bathing Assistance: Maximum assistance   Dressing Assistance: Maximum assistance     Functional Limitations Info             SPECIAL CARE FACTORS FREQUENCY  PT (By licensed PT), OT (By licensed OT)     PT Frequency: 5/wk OT Frequency: 5/wk            Contractures      Additional Factors Info  Code Status, Allergies, Insulin Sliding Scale Code Status Info: FULL Allergies Info: NKA   Insulin Sliding Scale Info: 6/day       Current Medications (05/08/2016):  This is the current hospital active medication list Current Facility-Administered  Medications  Medication Dose Route Frequency Provider Last Rate Last Dose  . 0.9 %  sodium chloride infusion  250 mL Intravenous PRN Merwyn Katosavid B Simonds, MD   Stopped at 05/06/16 1258  . 0.9 %  sodium chloride infusion   Intravenous Once Rhetta MuraJai-Gurmukh Samtani, MD      . acetaminophen (TYLENOL) tablet 650 mg  650 mg Oral Q4H PRN Merwyn Katosavid B Simonds, MD      . albuterol (PROVENTIL) (2.5 MG/3ML) 0.083% nebulizer solution 2.5 mg  2.5 mg Nebulization Q3H PRN Merwyn Katosavid B Simonds, MD      . chlorhexidine (PERIDEX) 0.12 % solution 15 mL  15 mL Mouth Rinse BID Nelda Bucksaniel J Feinstein, MD   15 mL at 05/07/16 2137  . diphenhydrAMINE (BENADRYL) capsule 25 mg  25 mg Oral Once Rhetta MuraJai-Gurmukh Samtani, MD      . enoxaparin (LOVENOX) injection 40 mg  40 mg Subcutaneous Q24H Rhetta MuraJai-Gurmukh Samtani, MD   40 mg at 05/07/16 1354  . famotidine (PEPCID) tablet 20 mg  20 mg Oral BID Jose Alexis FrockAngelo A de Dios, MD   20 mg at 05/07/16 2137  . furosemide (LASIX) injection 20 mg  20 mg Intravenous Once Rhetta MuraJai-Gurmukh Samtani, MD      . insulin aspart (novoLOG) injection 0-9 Units  0-9 Units Subcutaneous Q4H Jose Alexis FrockAngelo A de Dios, MD  2 Units at 05/08/16 0849  . ipratropium-albuterol (DUONEB) 0.5-2.5 (3) MG/3ML nebulizer solution 3 mL  3 mL Nebulization QID Jose Alexis Frock, MD   3 mL at 05/08/16 0851  . magnesium oxide (MAG-OX) tablet 400 mg  400 mg Oral BID Zetta Bills, MD   400 mg at 05/07/16 2137  . MEDLINE mouth rinse  15 mL Mouth Rinse q12n4p Nelda Bucks, MD      . ondansetron [facility]) injection 4 mg  4 mg Intravenous Q6H PRN Merwyn Katos, MD      . pneumococcal 23 valent vaccine (PNU-IMMUNE) injection 0.5 mL  0.5 mL Intramuscular Prior to discharge [facility], MD      . RESOURCE THICKENUP CLEAR   Oral PRN Louann Sjogren, MD         Discharge Medications: Please see discharge summary for a list of discharge medications.  Relevant Imaging Results:  Relevant Lab Results:   Additional Information SS#:  [id number]  Burna Sis, LCSW

## 2016-05-08 NOTE — Progress Notes (Signed)
CRITICAL VALUE ALERT  Critical value received:  Hgb=6.8  Date of notification:  05/08/2016  Time of notification:  0755  Critical value read back:Yes.    Nurse who received alert:  Lurena NidaGreta Myrel Rappleye  MD notified (1st page):  Dr. Mahala MenghiniSamtani  Time of first page:  0756  MD notified (2nd page):  Time of second page:  Responding MD:  Dr. Mahala MenghiniSamtani  Time MD responded:  [phone]

## 2016-05-09 LAB — TYPE AND SCREEN
ABO/RH(D): A POS
ANTIBODY SCREEN: NEGATIVE
UNIT DIVISION: 0
Unit division: 0

## 2016-05-09 LAB — VITAMIN B12: Vitamin B-12: 449 pg/mL (ref 180–914)

## 2016-05-09 LAB — BASIC METABOLIC PANEL
Anion gap: 6 (ref 5–15)
BUN: 23 mg/dL — AB (ref 6–20)
CALCIUM: 8.7 mg/dL — AB (ref 8.9–10.3)
CO2: 24 mmol/L (ref 22–32)
CREATININE: 1.3 mg/dL — AB (ref 0.61–1.24)
Chloride: 111 mmol/L (ref 101–111)
GFR calc Af Amer: >60 mL/min (ref >60)
GFR, EST NON AFRICAN AMERICAN: 53 mL/min — AB (ref >60)
GLUCOSE: 212 mg/dL — AB (ref 65–99)
Potassium: 3.4 mmol/L — ABNORMAL LOW (ref 3.5–5.1)
Sodium: 141 mmol/L (ref 135–145)

## 2016-05-09 LAB — CBC
HEMATOCRIT: 29.3 % — AB (ref 39.0–52.0)
Hemoglobin: 9.5 g/dL — ABNORMAL LOW (ref 13.0–17.0)
MCH: 28.3 pg (ref 26.0–34.0)
MCHC: 32.4 g/dL (ref 30.0–36.0)
MCV: 87.2 fL (ref 78.0–100.0)
PLATELETS: 281 10*3/uL (ref 150–400)
RBC: 3.36 MIL/uL — ABNORMAL LOW (ref 4.22–5.81)
RDW: 15.8 % — AB (ref 11.5–15.5)
WBC: 15.7 10*3/uL — AB (ref 4.0–10.5)

## 2016-05-09 LAB — GLUCOSE, CAPILLARY
GLUCOSE-CAPILLARY: 108 mg/dL — AB (ref 65–99)
GLUCOSE-CAPILLARY: 130 mg/dL — AB (ref 65–99)
GLUCOSE-CAPILLARY: 134 mg/dL — AB (ref 65–99)
GLUCOSE-CAPILLARY: 150 mg/dL — AB (ref 65–99)
Glucose-Capillary: 268 mg/dL — ABNORMAL HIGH (ref 65–99)
Glucose-Capillary: 118 mg/dL — ABNORMAL HIGH (ref 65–99)

## 2016-05-09 LAB — AMMONIA: Ammonia: 29 umol/L (ref 9–35)

## 2016-05-09 MED ORDER — SODIUM CHLORIDE 0.9% FLUSH
10.0000 mL | INTRAVENOUS | Status: DC | PRN
  Administered 2016-05-10: 10 mL
  Filled 2016-05-09: qty 40

## 2016-05-09 MED ORDER — SODIUM CHLORIDE 0.9% FLUSH
10.0000 mL | Freq: Two times a day (BID) | INTRAVENOUS | Status: DC
  Administered 2016-05-10: 10 mL

## 2016-05-09 NOTE — Care Management Important Message (Signed)
Important Message  Patient Details  Name: Ross Daniel MRN: [id number] Date of Birth: 04-Jul-1941   Medicare Important Message Given:  Yes    Kyla BalzarineShealy, [place] 05/09/2016, 11:44 AM

## 2016-05-09 NOTE — Progress Notes (Signed)
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 Pt tolerates thin liquids without signs of aspiration and self feeds regular solids with extra time and liquid wash. Will upgrade to dys 3 (mech soft) diet with thin liquids to facilitate mastication. No acute SLP f/u needed will sign off.    HPI HPI: 75 year old with diabetes, hypertension, admitted with severe sepsis, multilobar pna, possible aspiration. He was found unresponsive in his underwear outside his house by neighbors. He was brought to [facility] ED then transferred to [facility].  Intubated 9/6-11.      SLP Plan  All goals met     Recommendations  Diet recommendations: Dysphagia 3 (mechanical soft);Thin liquid Liquids provided via: Cup Medication Administration: Whole meds with liquid Supervision: Patient able to self feed;Intermittent supervision to cue for compensatory strategies Compensations: Slow rate;Small sips/bites Postural Changes and/or Swallow Maneuvers: Seated upright 90 degrees               SLP Diagnosis  Follow up Recommendations: None Plan: All goals met   Acute respiratory failure ([place]) - Plan: DG Chest Port 1 View, DG Chest Port 1 View  AKI (acute kidney injury) ([place]) - Plan: US Renal, US Renal  Acute respiratory failure, unspecified whether with hypoxia or hypercapnia (HCC)  Respiratory compromise - Plan: DG CHEST PORT 1 VIEW, DG CHEST PORT 1 VIEW  Respiratory failure (HCC) - Plan: DG Chest Port 1 [place], DG Chest [place] 1 [place], DG Chest [place] 1 [place], DG Chest Port 1 [place], DG Chest Port 1 [place], DG Chest Port 1 View  Pneumonia - Plan: DG Chest Port 1  View, DG Chest Port 1 View  SOB (shortness of breath) - Plan: DG Chest Port 1 View, [place] Chest Port 1 View  Acute respiratory failure with hypoxia [facility])     GO                Derk Doubek, Katherene Ponto 05/09/2016, 9:20 AM

## 2016-05-09 NOTE — Discharge Summary (Signed)
Physician Discharge Summary  Ross Daniel UEA:[id number] DOB: 1941/04/02 DOA: 04/30/2016  PCP: Juel Burrow, NP  Admit date: 04/30/2016 Discharge date: 05/09/2016  Time spent: 40 minutes  Recommendations for Outpatient Follow-up:  Needs OP rehab placement for strengthening We will need to complete metabolic panel, CBC, INR 1 week Patient should consider initiation on insulin if poor control going forward I would recheck iron levels probably in the next 2-3 weeks and give iron infusion Would check Hemoccults of stool as an outpatient also and refer for outpatient nonemergent colonoscopy  Discharge Diagnoses:  Active Problems:   AKI (acute kidney injury) (HCC)   Acute respiratory failure (HCC)   Respiratory compromise   Pneumonia   Pneumonia due to Klebsiella pneumoniae [facility])   Discharge Condition:   Diet recommendation: Diabetic heart healthy  Filed Weights   05/07/16 0349 05/08/16 0633 05/09/16 0500  Weight: 63.3 kg (139 lb 8 oz) 61 kg (134 lb 6.4 oz) 61.3 kg (135 lb 3.2 oz)    History of present illness:  75 year old male with known history of diabetes mellitus since 2009  Found unresponsive 04/30/2016 outside the house Admitted to [facility] emergency room with pneumonia and severe sepsis severe anion gap metabolic acidosis and acute kidney injury Initial bicarbonate 3, potassium 8  Blood pressure Anion gap 31 WBC 28 PH 7.046 BUN/creatinine 190/13.4 transferred to [facility] critical care service. Sputum cultures grew Klebsiella and Serratia patient initially started on broad spectrum vancomycin/Zosyn/azithromycin and  9/8 to Levaquin compelting on 05/06/16 Initially intubated and because of acute kidney injury kept on CVVHD and these were subsequently discontinued on 05/02/16 Nephrology is still following  transferred to hospitalist service 05/07/2016   Hospital Course:   Left lower lobe pneumonia-initially was on broad-spectrum antibiotics and cultures grew  out Klebsiella and Serratia completed by mouth Levaquin subsequently as well on 05/06/16 WBC 11 range  The patient was intubated and on pressors until 05/05/2016 from outside hospital This has clinically rewsolved-patient no longer coughing or in distress  Demand ischemia Troponin peaked at 0.40 on 9/6 and no further workup was initiated  Dysphagia Speech therapy saw the patient 05/06/2016 recommending home meds with pure some mild cognitive deficits and follow-up speech evaluation. Also recommending thickener and dysphagia 1 diet + nectar thick liquids. Unclear if patient can cognitively comply recommendations.  Acute kidney injury Hypernatremia Hypokalemia and hypomagnesemia Patient received aggressive repletion of IV fluids during this admission and sodium peaked at 150 on 05/07/2016 and is currently trending down to 147 The creatinine was 1.8 and patient was on CVVHD BUN/creatinine 51/1.8---35/1.5 GFR is improved on d/c home  Anemia of critical illness/dilutional anemia Needed transfusion 2 U PRBC this admit Patient hemoglobin 7.4 by MCV 90-->9.5/29.3 The iron level 13 and saturation levels 7 Consider IV iron as OP Would work-up with Colonoscopy as OP  DM with complications of neuropathy Sugars ranging in the 170 to 240 range Lantus was discontinued 05/06/2016 restarted 9/14 lower dose 4 u  Home medications include metformin 1000 twice a day  glipizide 15 twice a day  Restarted on d/c  Tachycardia NOS Unclear etiology Monitor on tele today  TME Unclear etiology at present Get RPR/b12/folate/ammonia No family present and no contact number sin chart to hellp coordinate care at present 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 prior to this illness and hospital stay  Acute protein energy malnutrition Supplement as able   Reflux Continue Pepcid 20 mg twice a day  Procedures:  Needed CVVHD on admit    Consultations:  Accepted form Pulm  Nephrology  Discharge Exam: Vitals:   05/08/16 2118 05/09/16 0402  BP: (!) 121/56 (!) 128/57  Pulse: 81 81  Resp: 18 18  Temp: 97.5 F (36.4 C) 98.3 F (36.8 C)    General: eomi ncat Cardiovascular: s1 s2 no m/r/g Respiratory: clear no added sound Mentation is much more improved patient is able to remember some events that not all and is able to orient to place and person more so than prior Moving all 4 limbs equally and drinking without any issue  Discharge Instructions    Current Discharge Medication List    CONTINUE these medications which have NOT CHANGED   Details  gabapentin (NEURONTIN) 300 MG capsule Take 600 mg by mouth 3 (three) times daily.    glipiZIDE (GLUCOTROL) 5 MG tablet Take 15 mg by mouth 2 (two) times daily.    meloxicam (MOBIC) 7.5 MG tablet Take 7.5 mg by mouth 2 (two) times daily as needed for pain.    metFORMIN (GLUCOPHAGE) 1000 MG tablet Take 1,000 mg by mouth 2 (two) times daily with a meal.    ranitidine (ZANTAC) 150 MG tablet Take 150 mg by mouth 2 (two) times daily.      STOP taking these medications     lisinopril (PRINIVIL,ZESTRIL) 20 MG tablet      sildenafil (VIAGRA) 100 MG tablet        No Known Allergies    The results of significant diagnostics from this hospitalization (including imaging, microbiology, ancillary and laboratory) are listed below for reference.    Significant Diagnostic Studies: [place]as Renal  Result Date: 04/30/2016 CLINICAL DATA:  Acute kidney injury. EXAM: RENAL / URINARY TRACT ULTRASOUND COMPLETE COMPARISON:  None. FINDINGS: Right Kidney: Length: 9.9 cm. Echogenicity within normal limits. No mass or hydronephrosis visualized. Left Kidney: Length: 10.4 cm. 11 mm cyst in the midpole. Echogenicity within normal limits. No mass or hydronephrosis visualized. Bladder: Decompressed with Foley catheter in place. IMPRESSION: No acute findings. Electronically Signed   By: Charlett NoseKevin   Dover M.D.   On: 04/30/2016 11:55   Dg Chest Port 1 View  Result Date: 05/05/2016 CLINICAL DATA:  Acute respiratory failure. EXAM: PORTABLE CHEST 1 VIEW COMPARISON:  05/03/2016. FINDINGS: 1104 hours. Endotracheal tube tip is 4.2 cm above the base the carina. Left IJ central line tip overlies the mid to distal SVC level. The NG tube passes into the stomach although the distal tip position is not included on the film. Patchy airspace disease in the left lung base persists but appears slightly improved in the interval. No focal airspace disease in the right lung. The cardiopericardial silhouette is within normal limits for size. The visualized bony structures of the thorax are intact. Telemetry leads overlie the chest. IMPRESSION: Slight interval improvement in left basilar aeration. Electronically Signed   By: Kennith CenterEric  Mansell M.D.   On: 05/05/2016 11:21   Dg Chest Port 1 View  Result Date: 05/03/2016 CLINICAL DATA:  Shortness of Breath EXAM: PORTABLE CHEST 1 VIEW COMPARISON:  05/02/2016 FINDINGS: Cardiomediastinal silhouette is stable. Stable right adjacent line position there is endotracheal tube in place with tip 4.2 cm above the carina. NG tube in place with tip in distal stomach. Persistent hazy bilateral basilar infiltrates left greater than right. No pneumothorax. IMPRESSION: Support apparatus in place. No pneumothorax. Persistent hazy bilateral basilar infiltrates left greater than right. Electronically Signed   By: Natasha MeadLiviu  Pop M.D.   On:  05/03/2016 11:09   Dg Chest Port 1 View  Result Date: 05/03/2016 CLINICAL DATA:  Pneumonia. EXAM: PORTABLE CHEST 1 VIEW COMPARISON:  Radiographs of May 02, 2016. FINDINGS: The heart size and mediastinal contours are within normal limits. Atherosclerosis of thoracic aorta is noted. Endotracheal and nasogastric tubes are unchanged in position. Left internal jugular catheter is also unchanged with distal tip in expected position of the SVC. Right lung is clear. Stable  left basilar opacity is noted consistent with pneumonia. No pneumothorax is noted. The visualized skeletal structures are unremarkable. IMPRESSION: Stable support apparatus. Stable left lower lobe pneumonia. Aortic atherosclerosis. Electronically Signed   By: Lupita Raider, M.D.   On: 05/03/2016 09:10   Dg Chest Port 1 View  Result Date: 05/02/2016 CLINICAL DATA:  Respiratory failure EXAM: PORTABLE CHEST 1 VIEW COMPARISON:  05/01/2016 FINDINGS: Cardiac shadow is stable. Endotracheal tube, nasogastric catheter and temporary dialysis catheter are again seen and stable. The lungs are well aerated bilaterally. Persistent left basilar infiltrate is seen although mildly improved when compared with the prior exam. Improved aeration is also noted in the right lung base. No sizable effusion or pneumothorax is noted. IMPRESSION: Persistent bibasilar infiltrates with some improved aeration. Electronically Signed   By: Alcide Clever M.D.   On: 05/02/2016 07:35   Dg Chest Port 1 View  Result Date: 05/01/2016 CLINICAL DATA:  Respiratory failure. EXAM: PORTABLE CHEST 1 VIEW COMPARISON:  04/30/2016. FINDINGS: Endotracheal tube, left IJ line in stable position. NG tube has been advanced, its tip in side hole oral well below the left hemidiaphragm. Heart size normal. Bibasilar pulmonary infiltrates, left side greater than right again noted,. No interim change. Bilateral pneumonia could present in this fashion. Small left pleural effusion again noted. No pneumothorax. IMPRESSION: 1. Interim advancement of NG tube, its tip and side hole are well below the left hemidiaphragm. Endotracheal tube and left IJ line in stable position . 2. Persistent unchanged bibasilar infiltrates, particularly from the left lung base. Bilateral pneumonia could present this fashion. Persistent left pleural effusion. Electronically Signed   By: Maisie Fus  Register   On: 05/01/2016 07:08   Dg Chest Port 1 View  Result Date: 04/30/2016 CLINICAL DATA:   Central line  NG tube EXAM: PORTABLE CHEST 1 VIEW COMPARISON:  Earlier today at 456 hours. FINDINGS: 0829 hours. Endotracheal tube terminates 4.0 cm above carina.Left internal jugular line terminates at the mid SVC. Nasogastric tube terminates at the proximal stomach, with side port at or just above the gastroesophageal junction. Normal heart size. Layering left pleural effusion. No pneumothorax. Pulmonary venous congestion is asymmetric, greater on the left. Left greater than right basilar predominant airspace opacities are not significantly changed. IMPRESSION: Nasogastric tube terminates at the proximal stomach with side port at or just above the gastroesophageal junction. Consider advancement approximately a 4-6 cm. Otherwise, similar appearance of the chest with layering left pleural effusion and pulmonary venous congestion. Bibasilar airspace opacities. Favor pneumonia, especially on the left. Electronically Signed   By: Jeronimo Greaves M.D.   On: 04/30/2016 08:55   Dg Chest Port 1 View  Result Date: 04/30/2016 CLINICAL DATA:  Hemodialysis catheter placement.  Initial encounter. EXAM: PORTABLE CHEST 1 VIEW COMPARISON:  None. FINDINGS: The patient's hemodialysis catheter noted ending overlying the mid SVC. Bibasilar airspace opacities, left greater than right, likely reflect multifocal pneumonia. Asymmetric pulmonary edema might have a similar appearance. No pleural effusion or pneumothorax is seen. The cardiomediastinal silhouette is normal in size. No acute osseous abnormalities are  identified. IMPRESSION: 1. Hemodialysis catheter noted ending overlying the mid SVC. 2. Bibasilar airspace opacities, left greater than right, likely reflect multifocal pneumonia. Asymmetric pulmonary edema might have a similar appearance. Electronically Signed   By: Roanna Raider M.D.   On: 04/30/2016 05:05    Microbiology: Recent Results (from the past 240 hour(s))  MRSA PCR Screening     Status: None   Collection Time:  04/30/16  3:15 AM  Result Value Ref Range Status   MRSA by PCR NEGATIVE NEGATIVE Final    Comment:        The GeneXpert MRSA Assay (FDA approved for NASAL specimens only), is one component of a comprehensive MRSA colonization surveillance program. It is not intended to diagnose MRSA infection nor to guide or monitor treatment for MRSA infections.   Culture, Urine     Status: None   Collection Time: 04/30/16  5:41 AM  Result Value Ref Range Status   Specimen Description URINE, RANDOM  Final   Special Requests NONE  Final   Culture NO GROWTH  Final   Report Status 05/01/2016 FINAL  Final  Culture, blood (Routine X 2) w Reflex to ID Panel     Status: None   Collection Time: 04/30/16  7:27 AM  Result Value Ref Range Status   Specimen Description BLOOD LEFT ARM  Final   Special Requests BOTTLES DRAWN AEROBIC AND ANAEROBIC 6CC EACH  Final   Culture NO GROWTH 5 DAYS  Final   Report Status 05/05/2016 FINAL  Final  Culture, blood (Routine X 2) w Reflex to ID Panel     Status: None   Collection Time: 04/30/16  7:27 AM  Result Value Ref Range Status   Specimen Description BLOOD LEFT HAND  Final   Special Requests BOTTLES DRAWN AEROBIC ONLY 6CC  Final   Culture NO GROWTH 5 DAYS  Final   Report Status 05/05/2016 FINAL  Final  Culture, respiratory (NON-Expectorated)     Status: None   Collection Time: 05/03/16  1:58 PM  Result Value Ref Range Status   Specimen Description TRACHEAL ASPIRATE  Final   Special Requests Normal  Final   Gram Stain   Final    MODERATE WBC PRESENT, PREDOMINANTLY PMN FEW GRAM NEGATIVE RODS RARE GRAM POSITIVE COCCI IN PAIRS RARE GRAM NEGATIVE COCCOBACILLI    Culture   Final    RARE KLEBSIELLA PNEUMONIAE RARE SERRATIA MARCESCENS    Report Status 05/06/2016 FINAL  Final   Organism ID, Bacteria KLEBSIELLA PNEUMONIAE  Final   Organism ID, Bacteria SERRATIA MARCESCENS  Final      Susceptibility   Klebsiella pneumoniae - MIC*    AMPICILLIN >=32 RESISTANT  Resistant     CEFAZOLIN <=4 SENSITIVE Sensitive     CEFEPIME <=1 SENSITIVE Sensitive     CEFTAZIDIME <=1 SENSITIVE Sensitive     CEFTRIAXONE <=1 SENSITIVE Sensitive     CIPROFLOXACIN <=0.25 SENSITIVE Sensitive     GENTAMICIN <=1 SENSITIVE Sensitive     IMIPENEM <=0.25 SENSITIVE Sensitive     TRIMETH/SULFA <=20 SENSITIVE Sensitive     AMPICILLIN/SULBACTAM 4 SENSITIVE Sensitive     PIP/TAZO <=4 SENSITIVE Sensitive     Extended ESBL NEGATIVE Sensitive     * RARE KLEBSIELLA PNEUMONIAE   Serratia marcescens - MIC*    CEFAZOLIN >=64 RESISTANT Resistant     CEFEPIME <=1 SENSITIVE Sensitive     CEFTAZIDIME <=1 SENSITIVE Sensitive     CEFTRIAXONE <=1 SENSITIVE Sensitive     CIPROFLOXACIN <=0.25 SENSITIVE  Sensitive     GENTAMICIN <=1 SENSITIVE Sensitive     TRIMETH/SULFA <=20 SENSITIVE Sensitive     * RARE SERRATIA MARCESCENS     Labs: Basic Metabolic Panel:  Recent Labs Lab 05/03/16 0340  05/04/16 0315 05/05/16 0421  05/06/16 0355 05/06/16 1500 05/07/16 0515 05/08/16 0706 05/09/16 0920  NA 138  < > 138 140  < > 150* 148* 147* 146* 141  K 4.2  < > 4.2 2.8*  < > 3.1* 3.0* 3.6 3.8 3.4*  CL 104  < > 103 101  < > 111 110 110 112* 111  CO2 29  < > 28 27  < > 28 25 27 28 24   GLUCOSE 159*  < > 143* 284*  < > 61* 227* 145* 173* 212*  BUN 18  < > 19 52*  < > 48* 44* 35* 26* 23*  CREATININE 0.95  < > 0.95 1.79*  < > 1.70* 1.66* 1.57* 1.51* 1.30*  CALCIUM 7.9*  < > 8.2* 8.4*  < > 9.2 8.9 9.0 8.8* 8.7*  MG 2.4  --  2.4 1.9  --  1.6*  --   --  1.7  --   PHOS 1.2*  < > 2.2* 3.9  --  1.9* 3.7 3.4  --   --   < > = values in this interval not displayed. Liver Function Tests:  Recent Labs Lab 05/04/16 0315 05/05/16 0421 05/06/16 0355 05/07/16 0515 05/08/16 0706  AST  --   --   --   --  21  ALT  --   --   --   --  20  ALKPHOS  --   --   --   --  57  BILITOT  --   --   --   --  0.8  PROT  --   --   --   --  5.0*  ALBUMIN 2.2* 2.0* 2.0* 2.0* 2.0*   No results for input(s): LIPASE,  AMYLASE in the last 168 hours.  Recent Labs Lab 05/09/16 0920  AMMONIA 29   CBC:  Recent Labs Lab 05/05/16 0422 05/06/16 0355 05/07/16 0515 05/08/16 0706 05/08/16 2315 05/09/16 0920  WBC 11.3* 10.7* 11.0* 14.3*  --  15.7*  NEUTROABS  --   --   --  12.4*  --   --   HGB 7.1* 7.3* 7.4* 6.8* 9.3* 9.5*  HCT 21.2* 22.3* 22.7* 21.5* 27.7* 29.3*  MCV 87.6 87.8 90.1 91.9  --  87.2  PLT 134* 174 257 320  --  281   Cardiac Enzymes: No results for input(s): CKTOTAL, CKMB, CKMBINDEX, TROPONINI in the last 168 hours. BNP: BNP (last 3 results)  Recent Labs  04/30/16 0535  BNP 114.7*    ProBNP (last 3 results) No results for input(s): PROBNP in the last 8760 hours.  CBG:  Recent Labs Lab 05/08/16 1716 05/08/16 2045 05/09/16 0010 05/09/16 0359 05/09/16 0759  GLUCAP 233* 99 108* 150* 130*       Signed:  Rhetta Mura MD   [facility] 05/09/2016, 1:01 PM

## 2016-05-09 NOTE — Progress Notes (Signed)
[facility] [place]- pt is agreeable as well as pt friend Lucendia HerrlichHoward Turner  [facility] phone: [phone] or [phone] Fax: [phone]  CSW will continue to follow  Burna SisJenna H. Mallori Araque, [facility] Clinical Social Worker [phone]

## 2016-05-09 NOTE — Progress Notes (Signed)
Physical Therapy Treatment Patient Details Name: Ross Daniel MRN: [id number] DOB: [birth year] Today's Date: 05/09/2016    History of Present Illness pt is a 75 y/o male with pmh of DM, HTN, found unresponsive outside by neighbors.  Found to have PNA and sepsis and transfered from [facility] to [facility].    PT Comments    Patient continues to demonstrate balance and cognitive impairments making him a high fall risk. Pt very confused upon PT arrival and needed help using his phone. Continue to progress as tolerated with anticipated d/c to SNF for further skilled PT services.    Follow Up Recommendations  SNF     Equipment Recommendations   (TBD)    Recommendations for Other Services       Precautions / Restrictions Precautions Precautions: Fall Restrictions Weight Bearing Restrictions: No    Mobility  Bed Mobility Overal bed mobility: Needs Assistance Bed Mobility: Supine to Sit     Supine to sit: Min assist     General bed mobility comments: multimodal cues for intiation of task and assist to elevate trunk into sitting  Transfers Overall transfer level: Needs assistance Equipment used: Rolling walker (2 wheeled) Transfers: Sit to/from Stand Sit to Stand: Min assist;+2 safety/equipment         General transfer comment: cues for safe hand placement; assist upon standing for balance and safe use of AD; pt impulsive to stand and required max multimodal cues to remain seated EOB/standing at EOB with RW to be cleaned up as pt had BM; pt sat once pericare complete and than stood again from EOB  Ambulation/Gait Ambulation/Gait assistance: Max assist Ambulation Distance (Feet): 12 Feet Assistive device: Rolling walker (2 wheeled) Gait Pattern/deviations: Step-through pattern;Decreased stride length;Trunk flexed     General Gait Details: assist to manage RW and for balance; max cues for posture, proximity of RW, and safety   Stairs            Wheelchair Mobility    Modified Rankin (Stroke Patients Only)       Balance     Sitting balance-Leahy Scale: Fair       Standing balance-Leahy Scale: Poor                      Cognition Arousal/Alertness: Awake/alert Behavior During Therapy: Anxious;Impulsive Overall Cognitive Status: No family/caregiver present to determine baseline cognitive functioning (likely impaired from baseline)                      Exercises      General Comments General comments (skin integrity, edema, etc.): pt needed assistance speaking to friend on the phone upon arrival; pt had the phone upside down and screaming "I can't hear you"      Pertinent Vitals/Pain Pain Assessment: No/denies pain    Home Living                      Prior Function            PT Goals (current goals can now be found in the care plan section) Acute Rehab PT Goals Patient Stated Goal: pt unable PT Goal Formulation: Patient unable to participate in goal setting Time For Goal Achievement: 05/21/16 Potential to Achieve Goals: Good Progress towards PT goals: Progressing toward goals    Frequency   Min 3X/week        PT Plan Current plan remains appropriate    Co-evaluation  End of Session Equipment Utilized During Treatment: Gait belt Activity Tolerance: Other (comment) (limited by cognition, weakness and fatigue) Patient left: in bed;with call bell/phone within reach;with bed alarm set     Time: 1131-1157 PT Time Calculation (min) (ACUTE ONLY): 26 min  Charges:  $Gait Training: 8-22 mins $Therapeutic Activity: 8-22 mins                    G Codes:      Derek MoundKellyn R Zurri Rudden Demani Mcbrien, PTA Pager: [phone]   05/09/2016, 1:18 PM

## 2016-05-09 NOTE — Progress Notes (Signed)
Patient had three loose stools this morning; MD notified; no new order at this time. Will continue to monitor,

## 2016-05-10 LAB — GLUCOSE, CAPILLARY
GLUCOSE-CAPILLARY: 106 mg/dL — AB (ref 65–99)
GLUCOSE-CAPILLARY: 177 mg/dL — AB (ref 65–99)
GLUCOSE-CAPILLARY: 159 mg/dL — AB (ref 65–99)
Glucose-Capillary: 127 mg/dL — ABNORMAL HIGH (ref 65–99)
Glucose-Capillary: 135 mg/dL — ABNORMAL HIGH (ref 65–99)

## 2016-05-10 LAB — RPR: RPR: NONREACTIVE

## 2016-05-10 NOTE — Clinical Social Work Note (Signed)
CSW spoke with Cabin crewAssistant Director Zack and informed that pt can discharge to SNF without [facility] as [facility]. [facility] and informed that pt discharging from hospital today and [facility]. [facility] requested that pt be given any meds needed for the day before sent to SNF and that RN include in report what meds pt was given before leaving via PTAR. CSW also notified pt friend that pt discharging to SNF today, and paged update to MD.

## 2016-05-10 NOTE — Clinical Social Work Note (Signed)
CSW contacted MD and informed that pt medically cleared for discharge. PASARR pending in the system and pt not able to discharge as [facility] closed on the weekend. CSW notified MD of update regarding PASARR pending. Pt will discharge during the upcoming week, hopefully on Monday.

## 2016-05-10 NOTE — Progress Notes (Signed)
Pt seen, no changes to DC Summary as completed by Dr.Samtani yesterday, except please stop MELOXICAM (Mobic) from medication list.  Zannie CovePreetha Savannah Morford, MD

## 2016-05-11 LAB — FOLATE RBC
FOLATE, HEMOLYSATE: 420.5 ng/mL
Folate, RBC: 1465 ng/mL (ref >498)
Hematocrit: 28.7 % — ABNORMAL LOW (ref 37.5–51.0)

## 2017-07-04 IMAGING — US US RENAL
1 series · 14 of 25 positions shown · non-contrast
Comparison: None.

CLINICAL DATA: Acute kidney injury.

EXAM:
RENAL / URINARY TRACT ULTRASOUND COMPLETE

[Series 1: us renal · 0.23mm/px · 14 of 49 slices shown]
[im 1/49]
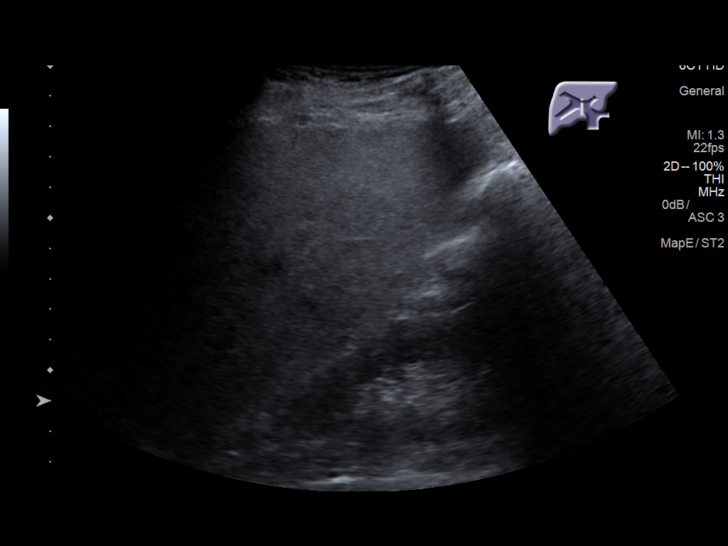
[im 5/49]
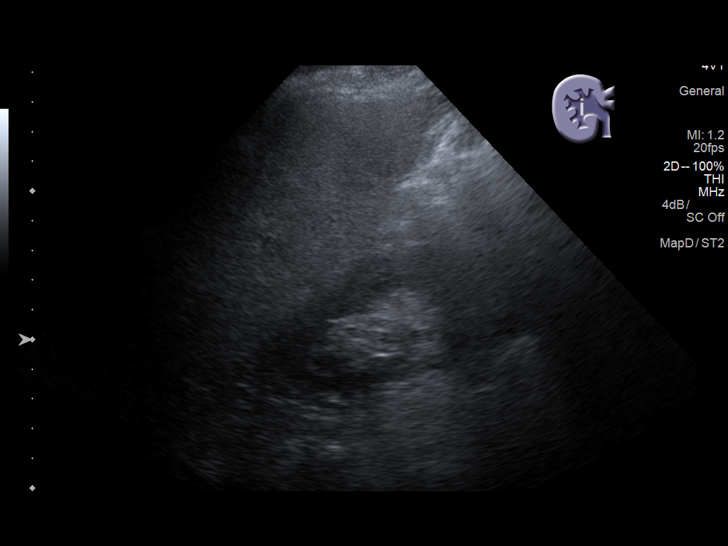
[im 9/49]
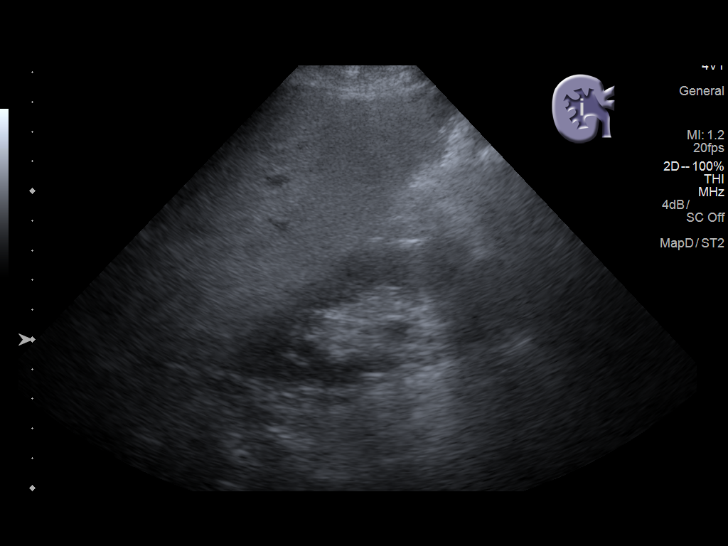
[im 13/49]
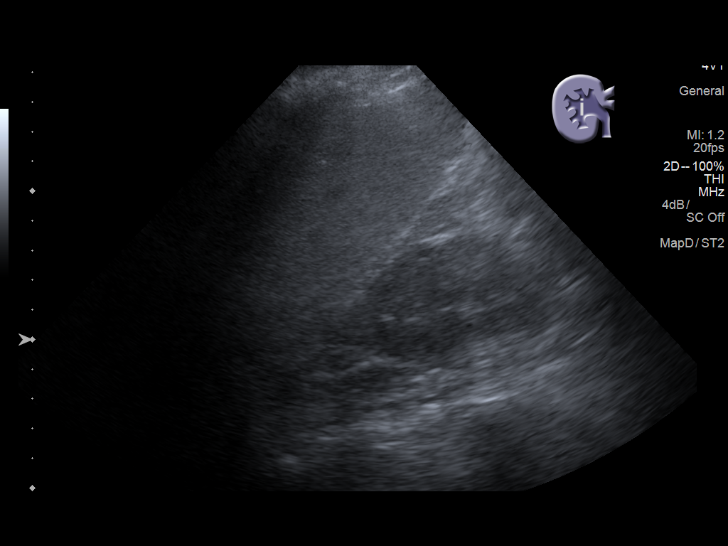
[im 17/49]
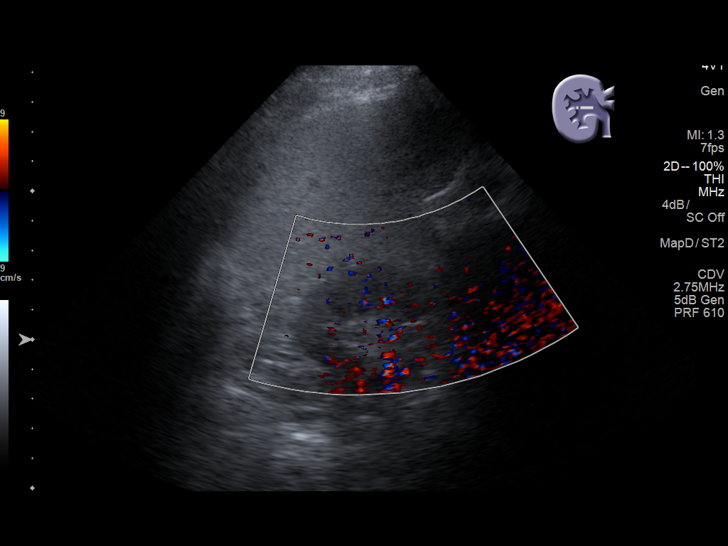
[im 19/49]
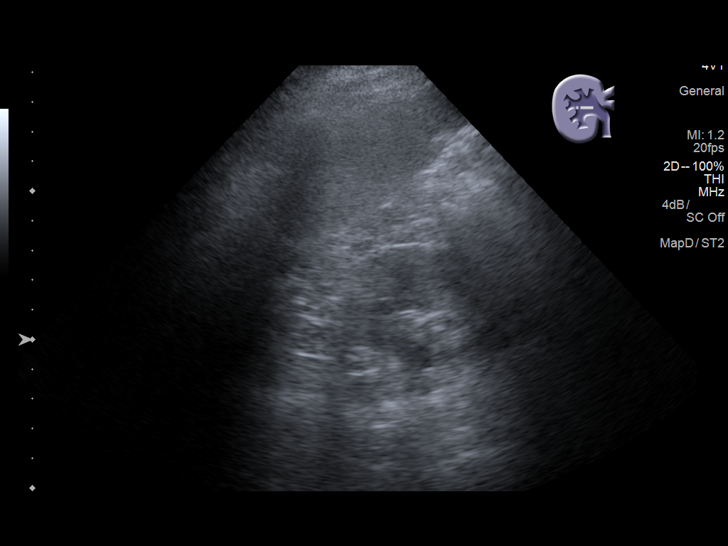
[im 23/49]
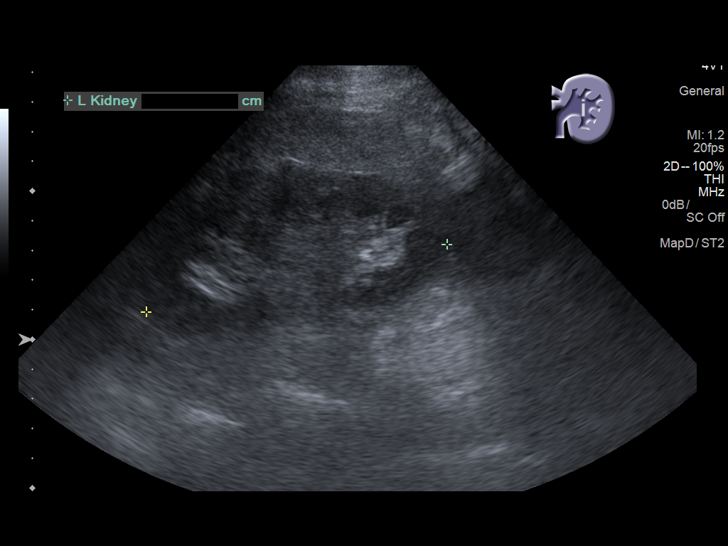
[im 27/49]
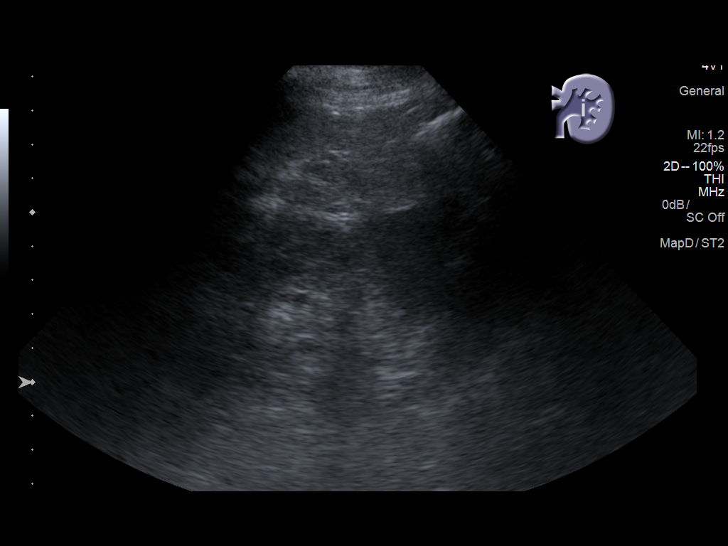
[im 31/49]
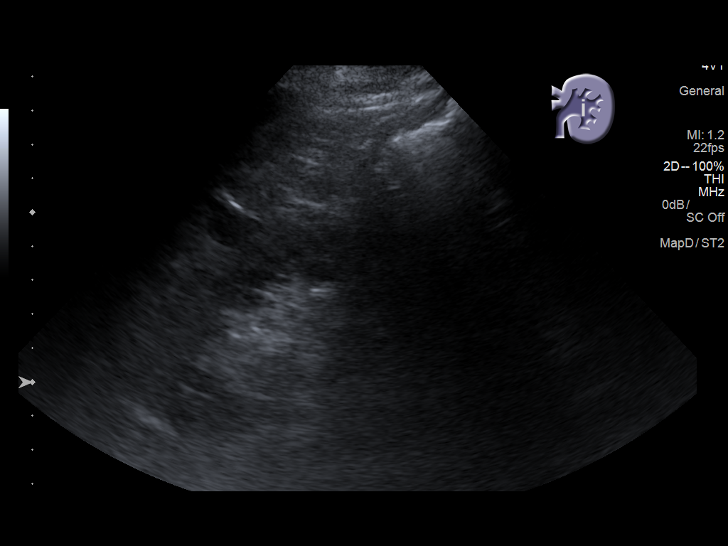
[im 33/49]
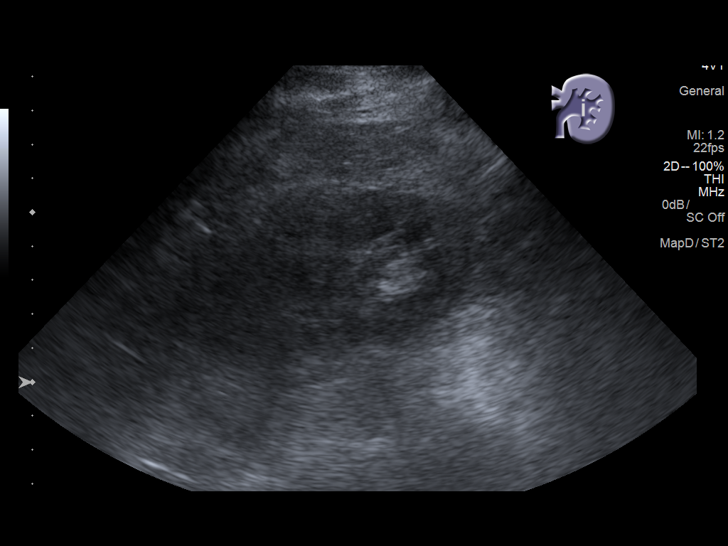
[im 37/49]
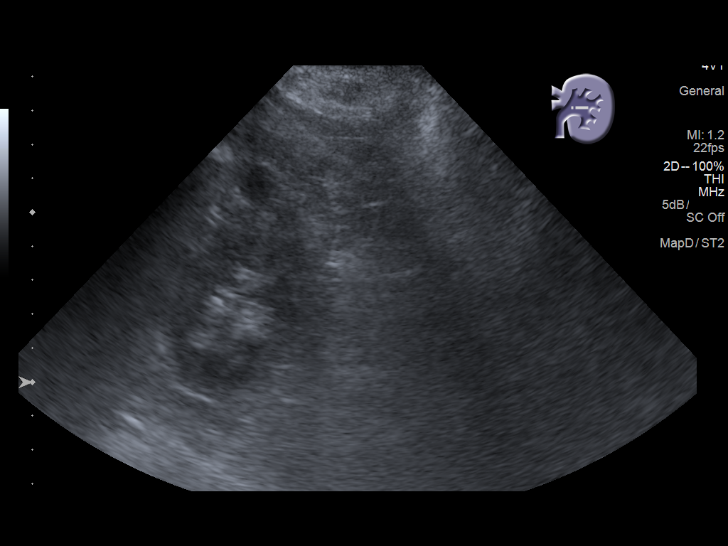
[im 41/49]
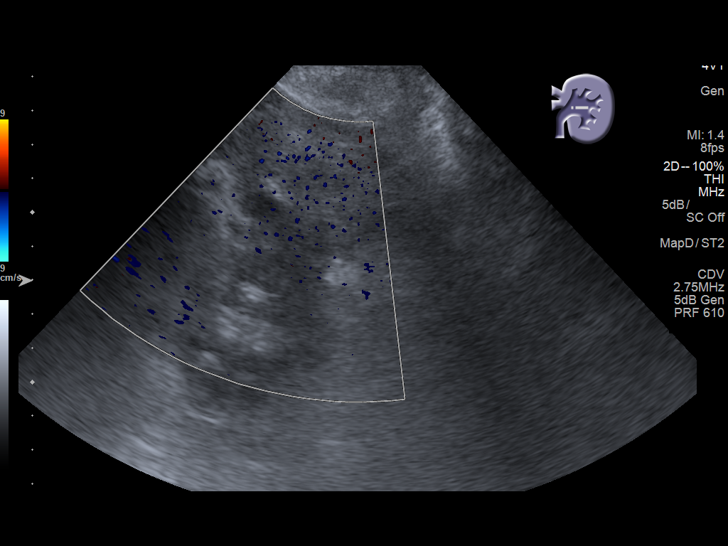
[im 45/49]
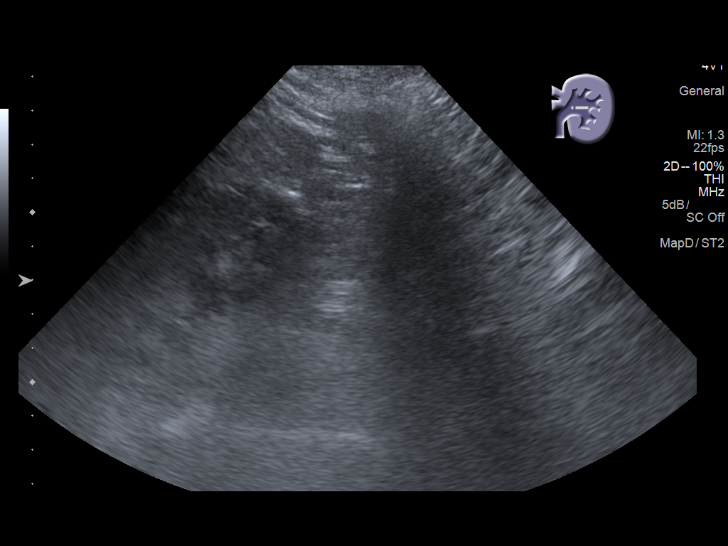
[im 49/49]
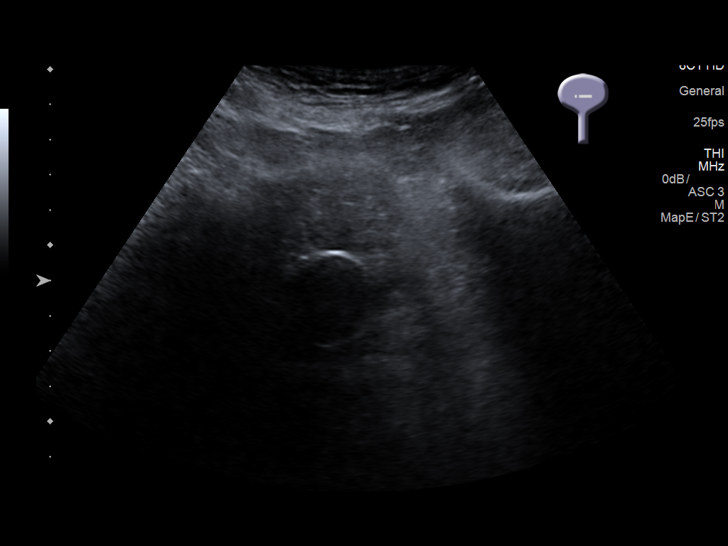

[14 of 25 positions shown; findings below may reference images not displayed]

FINDINGS: Right Kidney:

Length: 9.9 cm. Echogenicity within normal limits. No mass or
hydronephrosis visualized.

Left Kidney:

Length: 10.4 cm. 11 mm cyst in the midpole. Echogenicity within
normal limits. No mass or hydronephrosis visualized.

Bladder:

Decompressed with Foley catheter in place.
IMPRESSION: No acute findings.

## 2017-11-13 IMAGING — CR DG CHEST 1V PORT
1 series · 1 of 1 positions shown · non-contrast
Comparison: None.

CLINICAL DATA: Hemodialysis catheter placement.  Initial encounter.

EXAM:
PORTABLE CHEST 1 VIEW

[AP]
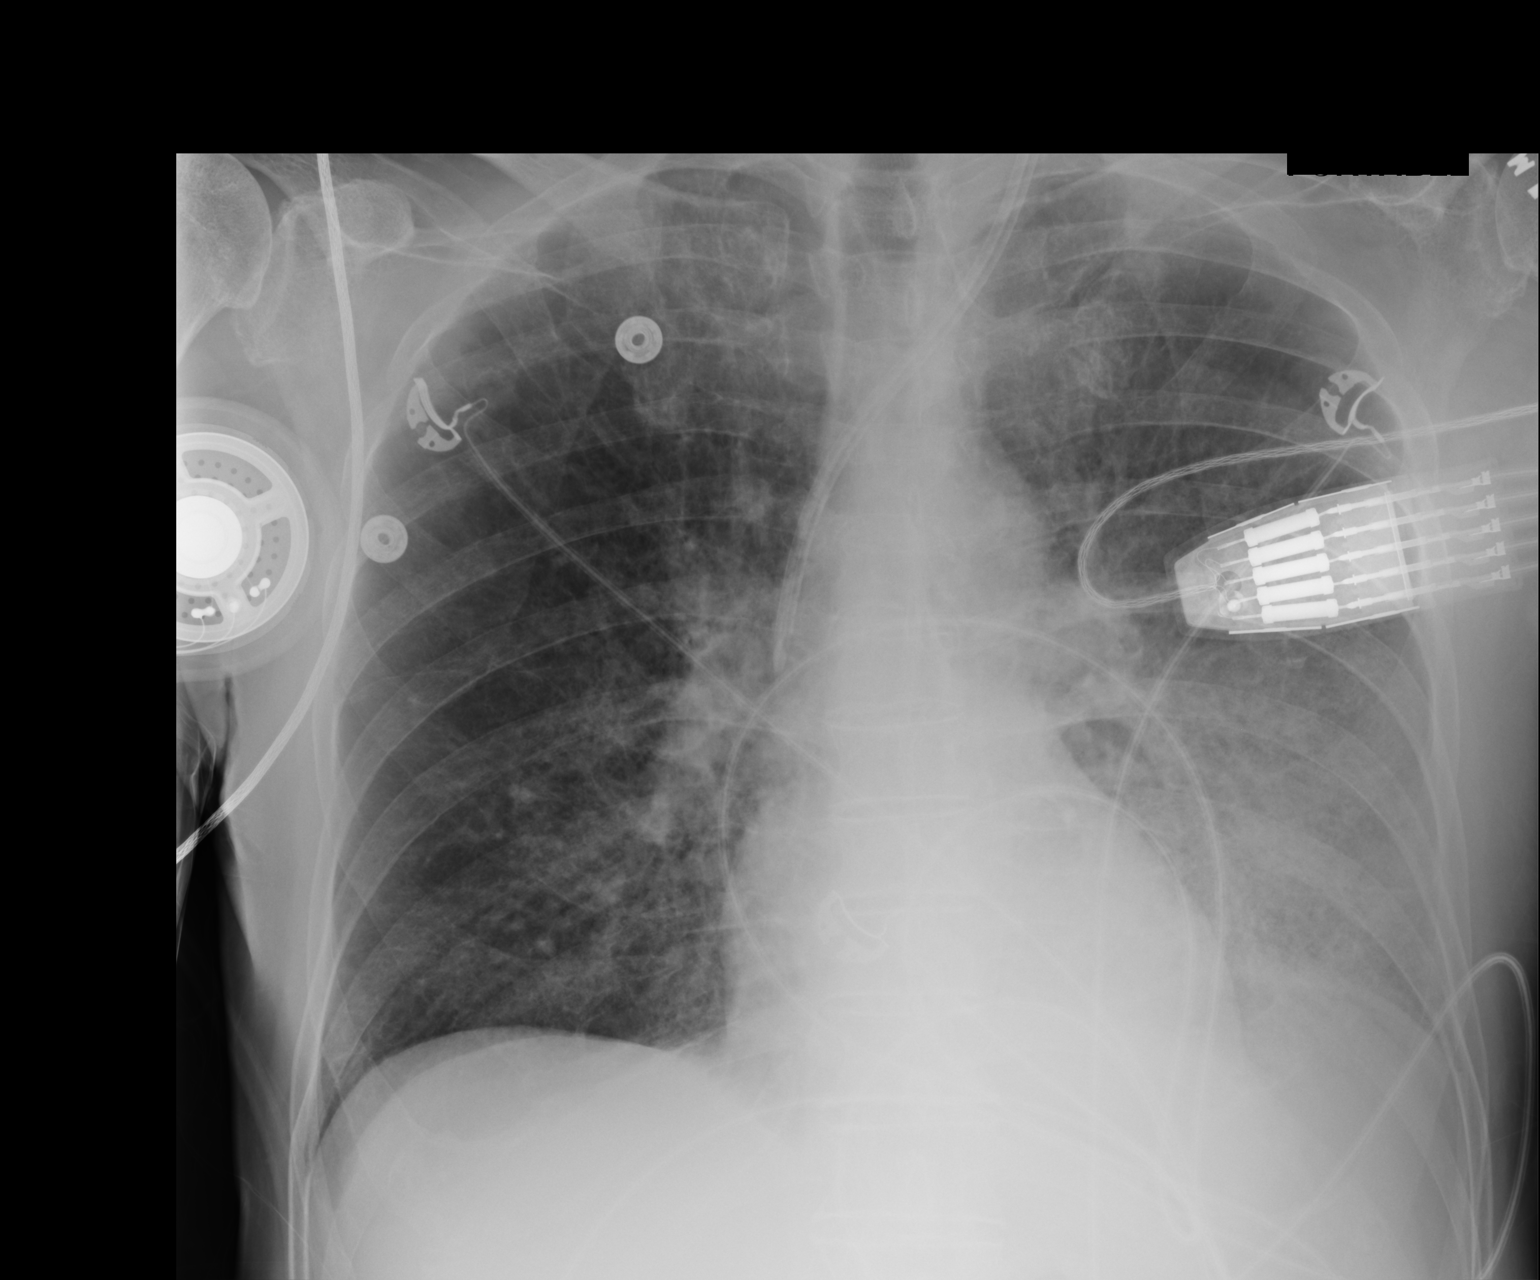

[1 of 1 positions shown; findings below may reference images not displayed]

FINDINGS: The patient's hemodialysis catheter noted ending overlying the mid
SVC.

Bibasilar airspace opacities, left greater than right, likely
reflect multifocal pneumonia. Asymmetric pulmonary edema might have
a similar appearance. No pleural effusion or pneumothorax is seen.

The cardiomediastinal silhouette is normal in size. No acute osseous
abnormalities are identified.
IMPRESSION: 1. Hemodialysis catheter noted ending overlying the mid SVC.
2. Bibasilar airspace opacities, left greater than right, likely
reflect multifocal pneumonia. Asymmetric pulmonary edema might have
a similar appearance.

## 2017-11-13 IMAGING — CR DG CHEST 1V PORT
1 series · 1 of 1 positions shown · non-contrast
Comparison: Earlier today at 456 hours.

CLINICAL DATA: Central line  NG tube

EXAM:
PORTABLE CHEST 1 VIEW

[AP]
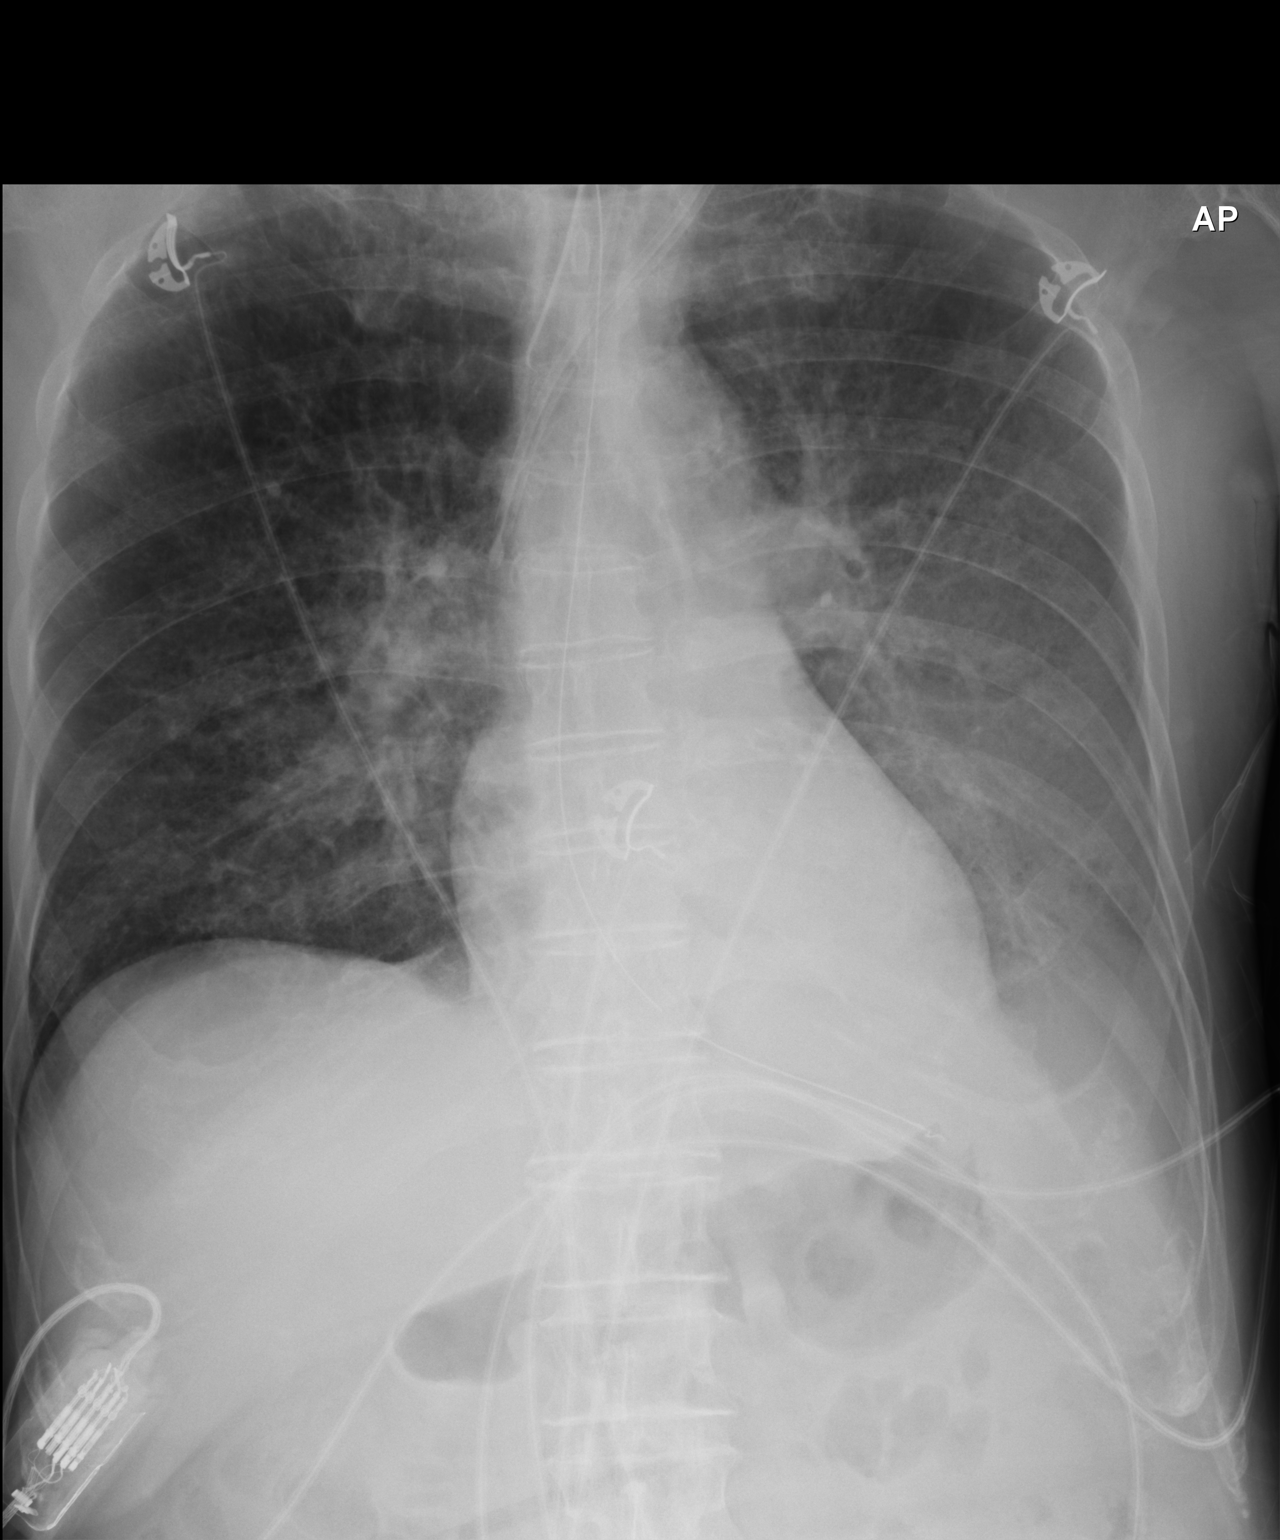

[1 of 1 positions shown; findings below may reference images not displayed]

FINDINGS: 2361 hours. Endotracheal tube terminates 4.0 cm above carina.Left
internal jugular line terminates at the mid SVC. Nasogastric tube
terminates at the proximal stomach, with side port at or just above
the gastroesophageal junction.

Normal heart size. Layering left pleural effusion. No pneumothorax.
Pulmonary venous congestion is asymmetric, greater on the left. Left
greater than right basilar predominant airspace opacities are not
significantly changed.
IMPRESSION: Nasogastric tube terminates at the proximal stomach with side port
at or just above the gastroesophageal junction. Consider advancement
approximately a 4-6 cm.

Otherwise, similar appearance of the chest with layering left
pleural effusion and pulmonary venous congestion.

Bibasilar airspace opacities. Favor pneumonia, especially on the
left.

## 2017-11-15 IMAGING — CR DG CHEST 1V PORT
2 series · 2 of 2 positions shown · non-contrast
Comparison: 05/01/2016

CLINICAL DATA: Respiratory failure

EXAM:
PORTABLE CHEST 1 VIEW

[AP (1 of 2)]
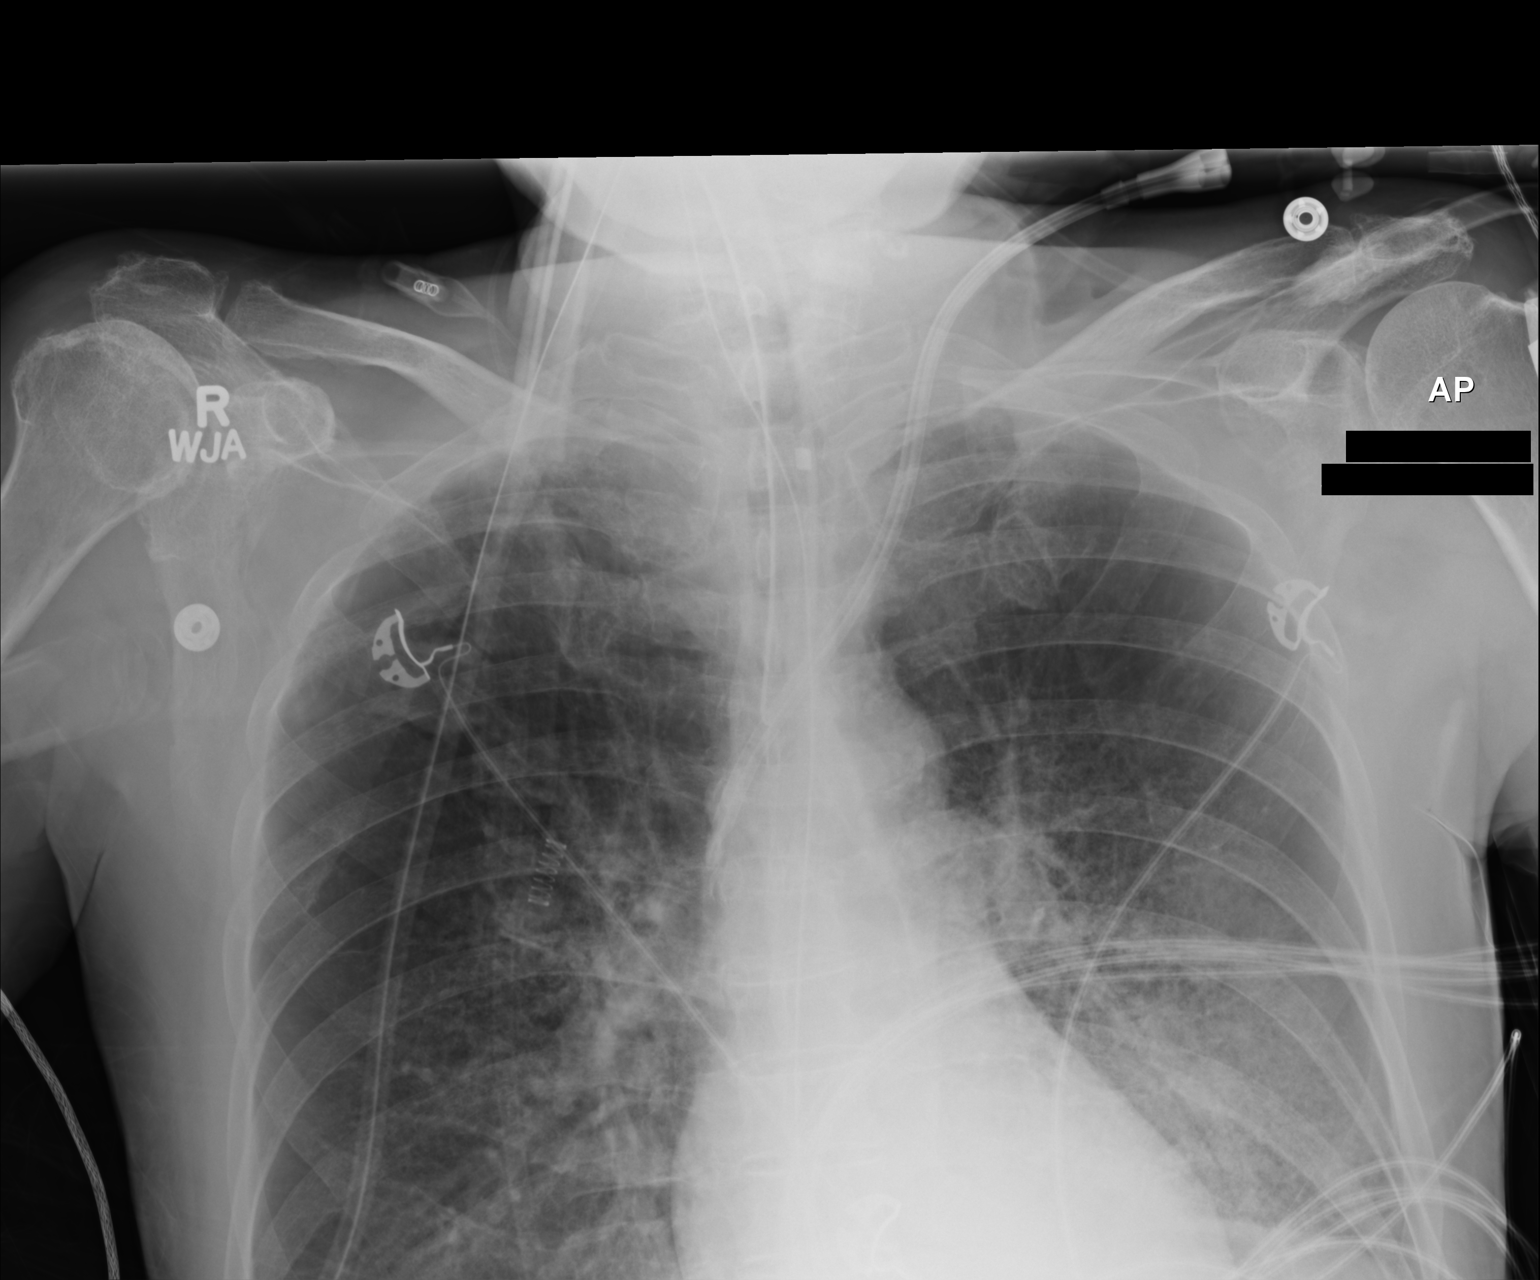

[AP (2 of 2)]
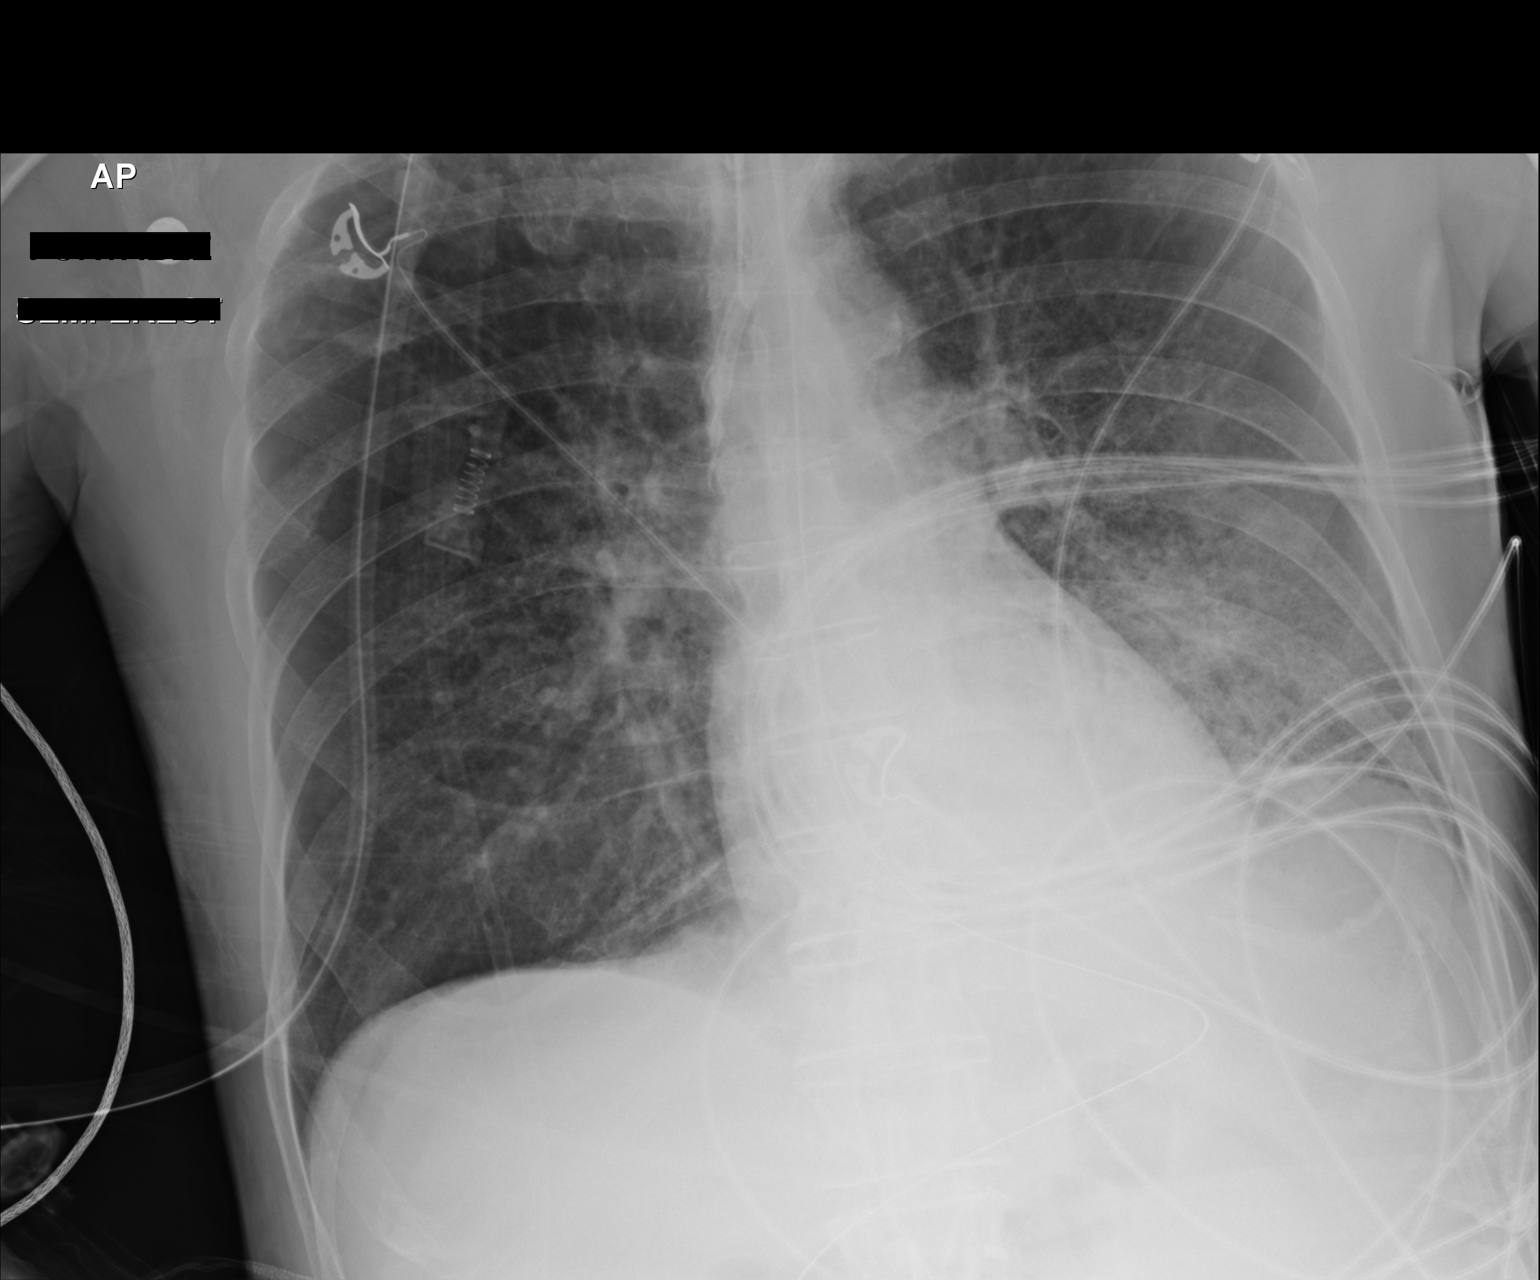

[2 of 2 positions shown; findings below may reference images not displayed]

FINDINGS: Cardiac shadow is stable. Endotracheal tube, nasogastric catheter
and temporary dialysis catheter are again seen and stable. The lungs
are well aerated bilaterally. Persistent left basilar infiltrate is
seen although mildly improved when compared with the prior exam.
Improved aeration is also noted in the right lung base. No sizable
effusion or pneumothorax is noted.
IMPRESSION: Persistent bibasilar infiltrates with some improved aeration.

## 2020-09-25 DEATH — deceased
